# Patient Record
Sex: Male | Born: 1955 | Race: White | Hispanic: No | Marital: Married | Smoking: Never smoker
Health system: Southern US, Community
[De-identification: ages and names within clinical notes are randomized; demographics above are authoritative.]

## PROBLEM LIST (undated history)

## (undated) DIAGNOSIS — F329 Major depressive disorder, single episode, unspecified: Secondary | ICD-10-CM

## (undated) DIAGNOSIS — N400 Enlarged prostate without lower urinary tract symptoms: Secondary | ICD-10-CM

## (undated) DIAGNOSIS — T4145XA Adverse effect of unspecified anesthetic, initial encounter: Secondary | ICD-10-CM

## (undated) DIAGNOSIS — M654 Radial styloid tenosynovitis [de Quervain]: Secondary | ICD-10-CM

## (undated) DIAGNOSIS — T8859XA Other complications of anesthesia, initial encounter: Secondary | ICD-10-CM

## (undated) DIAGNOSIS — M1990 Unspecified osteoarthritis, unspecified site: Secondary | ICD-10-CM

## (undated) DIAGNOSIS — F32A Depression, unspecified: Secondary | ICD-10-CM

## (undated) HISTORY — PX: OTHER SURGICAL HISTORY: SHX169

## (undated) HISTORY — PX: NASAL SINUS SURGERY: SHX719

## (undated) HISTORY — PX: NECK SURGERY: SHX720

## (undated) HISTORY — PX: BACK SURGERY: SHX140

## (undated) HISTORY — PX: COLONOSCOPY W/ POLYPECTOMY: SHX1380

---

## 1988-05-26 HISTORY — PX: CERVICAL LAMINECTOMY: SHX94

## 2003-01-06 ENCOUNTER — Ambulatory Visit (HOSPITAL_COMMUNITY): Admission: RE | Admit: 2003-01-06 | Discharge: 2003-01-06 | Payer: Self-pay | Admitting: Family Medicine

## 2003-01-06 ENCOUNTER — Encounter: Payer: Self-pay | Admitting: Family Medicine

## 2003-11-20 ENCOUNTER — Emergency Department (HOSPITAL_COMMUNITY): Admission: EM | Admit: 2003-11-20 | Discharge: 2003-11-21 | Payer: Self-pay | Admitting: Emergency Medicine

## 2005-04-08 ENCOUNTER — Ambulatory Visit: Payer: Self-pay | Admitting: Gastroenterology

## 2005-09-02 ENCOUNTER — Ambulatory Visit (HOSPITAL_COMMUNITY): Admission: RE | Admit: 2005-09-02 | Discharge: 2005-09-02 | Payer: Self-pay | Admitting: Family Medicine

## 2005-11-06 ENCOUNTER — Ambulatory Visit (HOSPITAL_COMMUNITY): Admission: RE | Admit: 2005-11-06 | Discharge: 2005-11-06 | Payer: Self-pay | Admitting: Family Medicine

## 2007-12-22 ENCOUNTER — Ambulatory Visit: Payer: Self-pay | Admitting: Orthopedic Surgery

## 2007-12-22 DIAGNOSIS — M25519 Pain in unspecified shoulder: Secondary | ICD-10-CM

## 2007-12-22 DIAGNOSIS — M25819 Other specified joint disorders, unspecified shoulder: Secondary | ICD-10-CM | POA: Insufficient documentation

## 2007-12-22 DIAGNOSIS — M7580 Other shoulder lesions, unspecified shoulder: Secondary | ICD-10-CM

## 2007-12-22 DIAGNOSIS — M5030 Other cervical disc degeneration, unspecified cervical region: Secondary | ICD-10-CM | POA: Insufficient documentation

## 2008-01-11 ENCOUNTER — Telehealth: Payer: Self-pay | Admitting: Orthopedic Surgery

## 2008-02-28 ENCOUNTER — Ambulatory Visit (HOSPITAL_COMMUNITY): Admission: RE | Admit: 2008-02-28 | Discharge: 2008-02-28 | Payer: Self-pay | Admitting: Neurosurgery

## 2008-05-26 HISTORY — PX: CERVICAL FUSION: SHX112

## 2008-06-08 ENCOUNTER — Encounter: Admission: RE | Admit: 2008-06-08 | Discharge: 2008-06-08 | Payer: Self-pay | Admitting: Family Medicine

## 2008-06-22 ENCOUNTER — Encounter: Admission: RE | Admit: 2008-06-22 | Discharge: 2008-06-22 | Payer: Self-pay | Admitting: Family Medicine

## 2008-07-07 ENCOUNTER — Encounter: Admission: RE | Admit: 2008-07-07 | Discharge: 2008-07-07 | Payer: Self-pay | Admitting: Family Medicine

## 2009-01-08 ENCOUNTER — Ambulatory Visit (HOSPITAL_COMMUNITY): Admission: RE | Admit: 2009-01-08 | Discharge: 2009-01-09 | Payer: Self-pay | Admitting: Neurosurgery

## 2009-01-30 ENCOUNTER — Encounter: Admission: RE | Admit: 2009-01-30 | Discharge: 2009-01-30 | Payer: Self-pay | Admitting: Neurosurgery

## 2010-08-31 LAB — CBC
HCT: 42 % (ref 39.0–52.0)
MCV: 92.5 fL (ref 78.0–100.0)
RBC: 4.54 MIL/uL (ref 4.22–5.81)
WBC: 4.4 10*3/uL (ref 4.0–10.5)

## 2010-08-31 LAB — BASIC METABOLIC PANEL
Chloride: 105 mEq/L (ref 96–112)
GFR calc Af Amer: >60 mL/min (ref >60)
GFR calc non Af Amer: >60 mL/min (ref >60)
Potassium: 4.7 mEq/L (ref 3.5–5.1)
Sodium: 139 mEq/L (ref 135–145)

## 2010-10-08 NOTE — Op Note (Signed)
NAME:  Geoffrey Andrews, Geoffrey Andrews NO.:  [id number]   MEDICAL RECORD NO.:  [id number]          PATIENT TYPE:  OIB   LOCATION:  3523                         FACILITY:  [facility]   PHYSICIAN:  Reinaldo Meeker, M.D. DATE OF BIRTH:  [birth year]   DATE OF PROCEDURE:  01/08/2009  DATE OF DISCHARGE:                               OPERATIVE REPORT   PREOPERATIVE DIAGNOSIS:  Herniated disk, C5-C6 and C6-C7.   POSTOPERATIVE DIAGNOSIS:  Herniated disk, C5-C6 and C6-C7.   PROCEDURE:  C5-C6 and C6-C7 anterior cervical diskectomy with bone bank  fusion followed by Helix anterior cervical plating.   SURGEON:  Reinaldo Meeker, MD   ASSISTANT:  Donalee Citrin, MD   PROCEDURE IN DETAIL:  After placed in the supine position and pinned in  10 pounds halter traction, the patient's neck was prepped and draped in  the usual sterile fashion.  Localizing x-rays were taken prior to  incision to identify the appropriate level.  Transverse incision was  made at the right anterior neck, carried through the midline, and headed  towards the medial aspect of the sternocleidomastoid muscle.  The  platysma muscle was then incised transversely.  The natural fascial  plane between the strap muscles medially and sternocleidomastoid  laterally was identified and followed down to the anterior aspect of the  cervical spine.  Longus colli muscles were identified, split in the  midline, and stripped away bilaterally with Special educational needs teacher.  Self-retaining retractor was placed for exposure.  X-ray  showed approach to be at the appropriate level.  Using a #15 blade, the  end of disk at C5-C6 and C6-C7 was incised.  Using pituitary rongeurs  and curettes at both levels, approximately 90% of the disk material was  removed.  High-speed drill was used to widen the interspaces at both  levels.  At this time the microscope was draped, brought into the field  and used for the remainder of the case.  Starting at  C6-C7, residual  disk material down to the posterior longitudinal ligament was removed.  The ligament was incised in the transverse fashion and the cut edge  removed with a Kerrison punch.  Thorough decompression was carried out,  particularly towards the right side where the pathology was located at  this level.  Both C7 nerve roots were identified and followed to their  foramen more so on the right side than the left at this level.  At this  time, inspection was carried out in all directions for any evidence of  residual compression and none could be identified.  Attention was then  turned to C5-C6 and once again remainder of disk material was removed.  Once again, the posterior longitudinal ligament was incised and the cut  edges were removed with the Kerrison punch.  At this time, inspection of  the left C5-C6 foramen yielded large amounts of herniated disk material  and these were removed in a piecemeal fashion until the C6 nerve root  was well visualized and decompressed.  At this time, inspection was  carried out at  both levels for any evidence of residual compression and  none could be identified.  One 7 and one 8-mm bone bank plugs were  reconstituted.  After irrigating once more and confirming hemostasis,  the 7-mm plug was impacted to C5-C6 and the 8-mm plug impacted to C6-C7.  Fluoroscopy showed them to be in good position.  Appropriate length  anterior cervical plate was then chosen.  Under fluoroscopic guidance,  drill holes were placed followed by placement of 30-mm screws x6 until  the locking mechanism was secured bilaterally at L3 levels.  At this  time, large amounts of irrigation was  carried out.  Any bleeding was controlled with bipolar coagulation.  The  wound was then closed with interrupted Vicryl on the platysma muscle and  inverted 5-0 PDS subcuticular layer and Steri-Strips on the skin.  Sterile dressing and soft collar applied.  The patient was extubated and   taken to recovery room in stable condition.           ______________________________  Reinaldo Meeker, M.D.     ROK/MEDQ  D:  01/08/2009  T:  01/09/2009  Job:  [id number]

## 2012-10-25 ENCOUNTER — Ambulatory Visit (HOSPITAL_COMMUNITY)
Admission: RE | Admit: 2012-10-25 | Discharge: 2012-10-25 | Disposition: A | Discharge status: Home / Self Care | Payer: BC Managed Care – PPO | Source: Ambulatory Visit | Attending: Family Medicine | Admitting: Family Medicine

## 2012-10-25 ENCOUNTER — Other Ambulatory Visit (HOSPITAL_COMMUNITY): Payer: Self-pay | Admitting: Family Medicine

## 2012-10-25 DIAGNOSIS — T59891A Toxic effect of other specified gases, fumes and vapors, accidental (unintentional), initial encounter: Secondary | ICD-10-CM | POA: Insufficient documentation

## 2012-10-25 DIAGNOSIS — Z7722 Contact with and (suspected) exposure to environmental tobacco smoke (acute) (chronic): Secondary | ICD-10-CM

## 2012-10-25 DIAGNOSIS — T59811A Toxic effect of smoke, accidental (unintentional), initial encounter: Secondary | ICD-10-CM | POA: Insufficient documentation

## 2013-12-29 ENCOUNTER — Ambulatory Visit (INDEPENDENT_AMBULATORY_CARE_PROVIDER_SITE_OTHER): Payer: BC Managed Care – PPO | Admitting: Orthopedic Surgery

## 2013-12-29 ENCOUNTER — Ambulatory Visit (INDEPENDENT_AMBULATORY_CARE_PROVIDER_SITE_OTHER): Payer: BC Managed Care – PPO

## 2013-12-29 VITALS — BP 109/58 | Ht 74.0 in | Wt 244.0 lb

## 2013-12-29 DIAGNOSIS — M654 Radial styloid tenosynovitis [de Quervain]: Secondary | ICD-10-CM | POA: Insufficient documentation

## 2013-12-29 DIAGNOSIS — M79642 Pain in left hand: Secondary | ICD-10-CM

## 2013-12-29 DIAGNOSIS — M79609 Pain in unspecified limb: Secondary | ICD-10-CM

## 2013-12-29 HISTORY — DX: Radial styloid tenosynovitis (de quervain): M65.4

## 2013-12-29 NOTE — Progress Notes (Signed)
Subjective:     Patient ID: Geoffrey Andrews, male   DOB: 1955-09-02, 58 y.o.   MRN: [id number]  HPI  Chief complaint left wrist and thumb pain with bilateral shoulder pain for 3 months  The patient describes no injury to his thumb. He has some chronic shoulder pain depending on his activities. He complains of pain over the extensor compartment #1 left wrist and thumb with some burning and radiating up into the arm. His symptoms are worse with activity and unrelieved by Advil  Previous surgical fusion of the C-spine in 2011 he reports no medical history of chronic medications he has no allergies he has a negative family history except for history of cancer and arthritis his father died of prostate cancer mother died of colon cancer  Review of systems is negative except for his joint pain and his visual disturbance requiring glasses.   Review of Systems     Objective:   Physical Exam  Vital signs are stable as recorded  General appearance is normal, body habitus normal muscular  The patient is alert and oriented x 3  The patient's mood and affect are normal  Gait assessment: Normal  The cardiovascular exam reveals normal pulses and temperature without edema or  swelling.  The lymphatic system is negative for palpable lymph nodes  The sensory exam is normal.  There are no pathologic reflexes.  Balance is normal.  Shoulder strength is 5 out of 5 manual muscle testing Exam of the left wrist  Inspection tenderness no swelling over first extensor compartment Range of motion full range of motion with positive de Quervain's test Stability stability of the wrist confirmed Watson test Strength grade 5 motor strength  Skin normal, no rash, or laceration. Provocative tests de Quervain's positive  A/P X-rays normal     Assessment:     X-ray show no significant arthritic changes in the hand or wrist  Impression de Quervain's syndrome    Plan:     Inject left thumb for de  Quervain's syndrome  VERBAL CONSENT TO INJECT TIME OUT COMPLETED   The left wrist was cleaned with alcohol and sprayed with ethyl chloride   Then 1cc of depomedrol (40mg ) and 3 cc of 1% lidocaine was injected into the 1st ext compartment   No complications

## 2013-12-29 NOTE — Patient Instructions (Signed)
Ice for 20 minutes in the evenings

## 2014-07-03 ENCOUNTER — Other Ambulatory Visit (HOSPITAL_COMMUNITY): Payer: Self-pay | Admitting: Family Medicine

## 2014-07-03 ENCOUNTER — Ambulatory Visit (HOSPITAL_COMMUNITY)
Admission: RE | Admit: 2014-07-03 | Discharge: 2014-07-03 | Disposition: A | Discharge status: Home / Self Care | Payer: BLUE CROSS/BLUE SHIELD | Source: Ambulatory Visit | Attending: Family Medicine | Admitting: Family Medicine

## 2014-07-03 DIAGNOSIS — Z7722 Contact with and (suspected) exposure to environmental tobacco smoke (acute) (chronic): Secondary | ICD-10-CM

## 2014-09-06 ENCOUNTER — Other Ambulatory Visit (HOSPITAL_COMMUNITY): Payer: Self-pay | Admitting: Family Medicine

## 2014-09-06 ENCOUNTER — Ambulatory Visit (HOSPITAL_COMMUNITY)
Admission: RE | Admit: 2014-09-06 | Discharge: 2014-09-06 | Disposition: A | Discharge status: Home / Self Care | Payer: BLUE CROSS/BLUE SHIELD | Source: Ambulatory Visit | Attending: Family Medicine | Admitting: Family Medicine

## 2014-09-06 DIAGNOSIS — M5442 Lumbago with sciatica, left side: Secondary | ICD-10-CM

## 2014-09-12 ENCOUNTER — Other Ambulatory Visit (HOSPITAL_COMMUNITY): Payer: Self-pay | Admitting: Family Medicine

## 2014-09-12 DIAGNOSIS — M5441 Lumbago with sciatica, right side: Secondary | ICD-10-CM

## 2014-09-12 DIAGNOSIS — M5442 Lumbago with sciatica, left side: Principal | ICD-10-CM

## 2014-09-14 ENCOUNTER — Ambulatory Visit
Admission: RE | Admit: 2014-09-14 | Discharge: 2014-09-14 | Disposition: A | Discharge status: Home / Self Care | Payer: BLUE CROSS/BLUE SHIELD | Source: Ambulatory Visit | Attending: Family Medicine | Admitting: Family Medicine

## 2014-09-14 DIAGNOSIS — M5441 Lumbago with sciatica, right side: Secondary | ICD-10-CM

## 2014-09-14 DIAGNOSIS — M5442 Lumbago with sciatica, left side: Principal | ICD-10-CM

## 2014-09-15 ENCOUNTER — Other Ambulatory Visit (HOSPITAL_COMMUNITY): Payer: BLUE CROSS/BLUE SHIELD

## 2014-09-19 ENCOUNTER — Other Ambulatory Visit: Payer: Self-pay | Admitting: Family Medicine

## 2014-09-19 DIAGNOSIS — M5136 Other intervertebral disc degeneration, lumbar region: Secondary | ICD-10-CM

## 2014-09-21 ENCOUNTER — Ambulatory Visit
Admission: RE | Admit: 2014-09-21 | Discharge: 2014-09-21 | Disposition: A | Discharge status: Home / Self Care | Payer: BLUE CROSS/BLUE SHIELD | Source: Ambulatory Visit | Attending: Family Medicine | Admitting: Family Medicine

## 2014-09-21 DIAGNOSIS — M5136 Other intervertebral disc degeneration, lumbar region: Secondary | ICD-10-CM

## 2014-09-21 MED ORDER — METHYLPREDNISOLONE ACETATE 40 MG/ML INJ SUSP (RADIOLOG
120.0000 mg | Freq: Once | INTRAMUSCULAR | Status: AC
  Administered 2014-09-21: 120 mg via EPIDURAL

## 2014-09-21 MED ORDER — IOHEXOL 180 MG/ML  SOLN
1.0000 mL | Freq: Once | INTRAMUSCULAR | Status: AC | PRN
  Administered 2014-09-21: 1 mL via EPIDURAL

## 2014-09-21 NOTE — Discharge Instructions (Signed)

## 2014-10-09 ENCOUNTER — Other Ambulatory Visit: Payer: Self-pay | Admitting: Family Medicine

## 2014-10-09 DIAGNOSIS — M5136 Other intervertebral disc degeneration, lumbar region: Secondary | ICD-10-CM

## 2014-10-10 ENCOUNTER — Other Ambulatory Visit: Payer: BLUE CROSS/BLUE SHIELD

## 2014-10-13 ENCOUNTER — Ambulatory Visit
Admission: RE | Admit: 2014-10-13 | Discharge: 2014-10-13 | Disposition: A | Discharge status: Home / Self Care | Payer: BLUE CROSS/BLUE SHIELD | Source: Ambulatory Visit | Attending: Family Medicine | Admitting: Family Medicine

## 2014-10-13 DIAGNOSIS — M5136 Other intervertebral disc degeneration, lumbar region: Secondary | ICD-10-CM

## 2014-10-13 MED ORDER — IOHEXOL 180 MG/ML  SOLN
1.0000 mL | Freq: Once | INTRAMUSCULAR | Status: AC | PRN
  Administered 2014-10-13: 1 mL via EPIDURAL

## 2014-10-13 MED ORDER — METHYLPREDNISOLONE ACETATE 40 MG/ML INJ SUSP (RADIOLOG
120.0000 mg | Freq: Once | INTRAMUSCULAR | Status: AC
  Administered 2014-10-13: 120 mg via EPIDURAL

## 2015-02-21 ENCOUNTER — Other Ambulatory Visit (HOSPITAL_COMMUNITY): Payer: Self-pay | Admitting: Neurosurgery

## 2015-03-01 ENCOUNTER — Encounter (HOSPITAL_COMMUNITY): Payer: Self-pay

## 2015-03-01 ENCOUNTER — Encounter (HOSPITAL_COMMUNITY)
Admission: RE | Admit: 2015-03-01 | Discharge: 2015-03-01 | Disposition: A | Discharge status: Home / Self Care | Payer: BLUE CROSS/BLUE SHIELD | Source: Ambulatory Visit | Attending: Neurosurgery | Admitting: Neurosurgery

## 2015-03-01 DIAGNOSIS — M4806 Spinal stenosis, lumbar region: Secondary | ICD-10-CM | POA: Insufficient documentation

## 2015-03-01 DIAGNOSIS — Z01812 Encounter for preprocedural laboratory examination: Secondary | ICD-10-CM | POA: Diagnosis not present

## 2015-03-01 HISTORY — DX: Unspecified osteoarthritis, unspecified site: M19.90

## 2015-03-01 HISTORY — DX: Adverse effect of unspecified anesthetic, initial encounter: T41.45XA

## 2015-03-01 HISTORY — DX: Other complications of anesthesia, initial encounter: T88.59XA

## 2015-03-01 HISTORY — DX: Depression, unspecified: F32.A

## 2015-03-01 HISTORY — DX: Benign prostatic hyperplasia without lower urinary tract symptoms: N40.0

## 2015-03-01 HISTORY — DX: Major depressive disorder, single episode, unspecified: F32.9

## 2015-03-01 HISTORY — DX: Radial styloid tenosynovitis (de quervain): M65.4

## 2015-03-01 LAB — CBC
HCT: 42.3 % (ref 39.0–52.0)
Hemoglobin: 14.1 g/dL (ref 13.0–17.0)
MCH: 30 pg (ref 26.0–34.0)
MCHC: 33.3 g/dL (ref 30.0–36.0)
MCV: 90 fL (ref 78.0–100.0)
PLATELETS: 160 10*3/uL (ref 150–400)
RBC: 4.7 MIL/uL (ref 4.22–5.81)
RDW: 12.6 % (ref 11.5–15.5)
WBC: 6 10*3/uL (ref 4.0–10.5)

## 2015-03-01 LAB — BASIC METABOLIC PANEL
Anion gap: 10 (ref 5–15)
BUN: 14 mg/dL (ref 6–20)
CALCIUM: 9.4 mg/dL (ref 8.9–10.3)
CO2: 23 mmol/L (ref 22–32)
CREATININE: 0.9 mg/dL (ref 0.61–1.24)
Chloride: 103 mmol/L (ref 101–111)
GFR calc Af Amer: >60 mL/min (ref >60)
GLUCOSE: 117 mg/dL — AB (ref 65–99)
Potassium: 4 mmol/L (ref 3.5–5.1)
Sodium: 136 mmol/L (ref 135–145)

## 2015-03-01 LAB — SURGICAL PCR SCREEN
MRSA, PCR: NEGATIVE
STAPHYLOCOCCUS AUREUS: NEGATIVE

## 2015-03-01 NOTE — Progress Notes (Signed)
Mr Delduca denies shortness of breath or chest pain, has not seen a cardiologist.

## 2015-03-01 NOTE — Pre-Procedure Instructions (Signed)
    Geoffrey Andrews  03/01/2015    Your procedure is scheduled on Thursday, October 13.  Report to [facility] Admitting at 9:00 A.M.                 Your surgery is scheduled for 11:00A.M   Call this number if you have problems the morning of [phone]                 For any other questions, please call [phone], Monday - Friday 8 AM - 4 PM.   Remember:  Do not eat food or drink liquids after midnight Wednesdau, October 12  Take these medicines the morning of surgery with A SIP OF WATER : Dutasteride-Tamsulosin HCl (JALYN)   Do not wear jewelry, make-up or nail polish.   Do not wear lotions, powders, or perfumes.                Men may shave face and neck.   Do not bring valuables to the hospital.   [facility] is not responsible for any belongings or valuables.  Contacts, dentures or bridgework may not be worn into surgery.  Leave your suitcase in the car.  After surgery it may be brought to your room.  For patients admitted to the hospital, discharge time will be determined by your treatment team.  Patients discharged the day of surgery will not be allowed to drive home.   Name and phone number of your drive: -  Special instructions:  Review  [facility] - Preparing For Surgery.  Please read over the following fact sheets that you were given. Pain Booklet, Coughing and Deep Breathing and Surgical Site Infection Prevention

## 2015-03-07 MED ORDER — CEFAZOLIN SODIUM-DEXTROSE 2-3 GM-% IV SOLR
2.0000 g | INTRAVENOUS | Status: AC
  Administered 2015-03-08: 2 g via INTRAVENOUS
  Filled 2015-03-07: qty 50

## 2015-03-07 MED ORDER — DEXAMETHASONE SODIUM PHOSPHATE 10 MG/ML IJ SOLN
10.0000 mg | INTRAMUSCULAR | Status: AC
  Administered 2015-03-08: 10 mg via INTRAVENOUS
  Filled 2015-03-07: qty 1

## 2015-03-08 ENCOUNTER — Ambulatory Visit (HOSPITAL_COMMUNITY): Payer: BLUE CROSS/BLUE SHIELD

## 2015-03-08 ENCOUNTER — Encounter (HOSPITAL_COMMUNITY): Payer: Self-pay | Admitting: *Deleted

## 2015-03-08 ENCOUNTER — Ambulatory Visit (HOSPITAL_COMMUNITY): Payer: BLUE CROSS/BLUE SHIELD | Admitting: Anesthesiology

## 2015-03-08 ENCOUNTER — Encounter (HOSPITAL_COMMUNITY)
Admission: RE | Disposition: A | Discharge status: Home / Self Care | Payer: Self-pay | Source: Ambulatory Visit | Attending: Neurosurgery

## 2015-03-08 ENCOUNTER — Ambulatory Visit (HOSPITAL_COMMUNITY)
Admission: RE | Admit: 2015-03-08 | Discharge: 2015-03-09 | Disposition: A | Discharge status: Home / Self Care | Payer: BLUE CROSS/BLUE SHIELD | Source: Ambulatory Visit | Attending: Neurosurgery | Admitting: Neurosurgery

## 2015-03-08 DIAGNOSIS — M1990 Unspecified osteoarthritis, unspecified site: Secondary | ICD-10-CM | POA: Insufficient documentation

## 2015-03-08 DIAGNOSIS — M48062 Spinal stenosis, lumbar region with neurogenic claudication: Secondary | ICD-10-CM | POA: Diagnosis present

## 2015-03-08 DIAGNOSIS — N400 Enlarged prostate without lower urinary tract symptoms: Secondary | ICD-10-CM | POA: Insufficient documentation

## 2015-03-08 DIAGNOSIS — M4806 Spinal stenosis, lumbar region: Secondary | ICD-10-CM | POA: Insufficient documentation

## 2015-03-08 DIAGNOSIS — Z419 Encounter for procedure for purposes other than remedying health state, unspecified: Secondary | ICD-10-CM

## 2015-03-08 HISTORY — PX: LUMBAR LAMINECTOMY/DECOMPRESSION MICRODISCECTOMY: SHX5026

## 2015-03-08 SURGERY — LUMBAR LAMINECTOMY/DECOMPRESSION MICRODISCECTOMY 1 LEVEL
Anesthesia: General | Site: Back

## 2015-03-08 MED ORDER — PANTOPRAZOLE SODIUM 40 MG PO TBEC
40.0000 mg | DELAYED_RELEASE_TABLET | Freq: Every day | ORAL | Status: DC
  Administered 2015-03-08 – 2015-03-09 (×2): 40 mg via ORAL
  Filled 2015-03-08: qty 1

## 2015-03-08 MED ORDER — PROPOFOL 10 MG/ML IV BOLUS
INTRAVENOUS | Status: AC
  Filled 2015-03-08: qty 20

## 2015-03-08 MED ORDER — SODIUM CHLORIDE 0.9 % IJ SOLN: 3.0000 mL | INTRAMUSCULAR | Status: DC | PRN

## 2015-03-08 MED ORDER — DUTASTERIDE-TAMSULOSIN HCL 0.5-0.4 MG PO CAPS: 1.0000 | ORAL_CAPSULE | Freq: Every day | ORAL | Status: DC

## 2015-03-08 MED ORDER — BISACODYL 5 MG PO TBEC: 5.0000 mg | DELAYED_RELEASE_TABLET | Freq: Every day | ORAL | Status: DC | PRN

## 2015-03-08 MED ORDER — ACETAMINOPHEN 325 MG PO TABS: 650.0000 mg | ORAL_TABLET | ORAL | Status: DC | PRN

## 2015-03-08 MED ORDER — EPHEDRINE SULFATE 50 MG/ML IJ SOLN
INTRAMUSCULAR | Status: AC
  Filled 2015-03-08: qty 1

## 2015-03-08 MED ORDER — FENTANYL CITRATE (PF) 100 MCG/2ML IJ SOLN
INTRAMUSCULAR | Status: DC | PRN
  Administered 2015-03-08: 50 ug via INTRAVENOUS
  Administered 2015-03-08: 100 ug via INTRAVENOUS

## 2015-03-08 MED ORDER — TAMSULOSIN HCL 0.4 MG PO CAPS
0.4000 mg | ORAL_CAPSULE | Freq: Every day | ORAL | Status: DC
  Administered 2015-03-09: 0.4 mg via ORAL
  Filled 2015-03-08: qty 1

## 2015-03-08 MED ORDER — STERILE WATER FOR INJECTION IJ SOLN
INTRAMUSCULAR | Status: AC
  Filled 2015-03-08: qty 10

## 2015-03-08 MED ORDER — OXYCODONE HCL 5 MG/5ML PO SOLN
5.0000 mg | Freq: Once | ORAL | Status: DC | PRN
Start: 2015-03-08 — End: 2015-03-08

## 2015-03-08 MED ORDER — HYDROCODONE-ACETAMINOPHEN 5-325 MG PO TABS
1.0000 | ORAL_TABLET | ORAL | Status: DC | PRN
  Administered 2015-03-08 – 2015-03-09 (×3): 2 via ORAL
  Filled 2015-03-08 (×4): qty 2

## 2015-03-08 MED ORDER — LIDOCAINE HCL (CARDIAC) 20 MG/ML IV SOLN
INTRAVENOUS | Status: AC
  Filled 2015-03-08: qty 5

## 2015-03-08 MED ORDER — GLYCOPYRROLATE 0.2 MG/ML IJ SOLN
INTRAMUSCULAR | Status: AC
  Filled 2015-03-08: qty 3

## 2015-03-08 MED ORDER — PHENOL 1.4 % MT LIQD: 1.0000 | OROMUCOSAL | Status: DC | PRN

## 2015-03-08 MED ORDER — ONDANSETRON HCL 4 MG/2ML IJ SOLN: 4.0000 mg | INTRAMUSCULAR | Status: DC | PRN

## 2015-03-08 MED ORDER — DUTASTERIDE 0.5 MG PO CAPS
0.5000 mg | ORAL_CAPSULE | Freq: Every day | ORAL | Status: DC
  Administered 2015-03-09: 0.5 mg via ORAL
  Filled 2015-03-08: qty 1

## 2015-03-08 MED ORDER — PANTOPRAZOLE SODIUM 40 MG IV SOLR: 40.0000 mg | Freq: Every day | INTRAVENOUS | Status: DC

## 2015-03-08 MED ORDER — KETOROLAC TROMETHAMINE 30 MG/ML IJ SOLN
30.0000 mg | Freq: Four times a day (QID) | INTRAMUSCULAR | Status: DC
  Administered 2015-03-08 – 2015-03-09 (×3): 30 mg via INTRAVENOUS
  Filled 2015-03-08 (×3): qty 1

## 2015-03-08 MED ORDER — HEMOSTATIC AGENTS (NO CHARGE) OPTIME
TOPICAL | Status: DC | PRN
  Administered 2015-03-08 (×2): 1 via TOPICAL

## 2015-03-08 MED ORDER — MIDAZOLAM HCL 5 MG/5ML IJ SOLN
INTRAMUSCULAR | Status: DC | PRN
  Administered 2015-03-08 (×2): 1 mg via INTRAVENOUS

## 2015-03-08 MED ORDER — THROMBIN 5000 UNITS EX SOLR
CUTANEOUS | Status: DC | PRN
  Administered 2015-03-08 (×4): 5000 [IU] via TOPICAL

## 2015-03-08 MED ORDER — DOCUSATE SODIUM 100 MG PO CAPS
100.0000 mg | ORAL_CAPSULE | Freq: Two times a day (BID) | ORAL | Status: DC
  Administered 2015-03-08 – 2015-03-09 (×2): 100 mg via ORAL
  Filled 2015-03-08 (×2): qty 1

## 2015-03-08 MED ORDER — CEFAZOLIN SODIUM-DEXTROSE 2-3 GM-% IV SOLR
2.0000 g | Freq: Three times a day (TID) | INTRAVENOUS | Status: AC
  Administered 2015-03-08 – 2015-03-09 (×2): 2 g via INTRAVENOUS
  Filled 2015-03-08 (×2): qty 50

## 2015-03-08 MED ORDER — SODIUM CHLORIDE 0.9 % IV SOLN: 250.0000 mL | INTRAVENOUS | Status: DC

## 2015-03-08 MED ORDER — NEOSTIGMINE METHYLSULFATE 10 MG/10ML IV SOLN
INTRAVENOUS | Status: AC
  Filled 2015-03-08: qty 1

## 2015-03-08 MED ORDER — ROCURONIUM BROMIDE 100 MG/10ML IV SOLN
INTRAVENOUS | Status: DC | PRN
  Administered 2015-03-08: 50 mg via INTRAVENOUS

## 2015-03-08 MED ORDER — CYCLOBENZAPRINE HCL 10 MG PO TABS
10.0000 mg | ORAL_TABLET | Freq: Three times a day (TID) | ORAL | Status: DC | PRN
  Administered 2015-03-08: 10 mg via ORAL
  Filled 2015-03-08: qty 1

## 2015-03-08 MED ORDER — SODIUM CHLORIDE 0.9 % IR SOLN
Status: DC | PRN
  Administered 2015-03-08: 13:00:00

## 2015-03-08 MED ORDER — HYDROMORPHONE HCL 1 MG/ML IJ SOLN
INTRAMUSCULAR | Status: AC
  Administered 2015-03-08: 0.5 mg via INTRAVENOUS
  Filled 2015-03-08: qty 1

## 2015-03-08 MED ORDER — DEXAMETHASONE SODIUM PHOSPHATE 4 MG/ML IJ SOLN: 4.0000 mg | Freq: Four times a day (QID) | INTRAMUSCULAR | Status: AC

## 2015-03-08 MED ORDER — SODIUM CHLORIDE 0.9 % IJ SOLN: 3.0000 mL | Freq: Two times a day (BID) | INTRAMUSCULAR | Status: DC

## 2015-03-08 MED ORDER — VECURONIUM BROMIDE 10 MG IV SOLR
INTRAVENOUS | Status: AC
  Filled 2015-03-08: qty 10

## 2015-03-08 MED ORDER — ZOLPIDEM TARTRATE 5 MG PO TABS: 5.0000 mg | ORAL_TABLET | Freq: Every evening | ORAL | Status: DC | PRN

## 2015-03-08 MED ORDER — LIDOCAINE HCL (CARDIAC) 20 MG/ML IV SOLN
INTRAVENOUS | Status: DC | PRN
  Administered 2015-03-08: 60 mg via INTRAVENOUS

## 2015-03-08 MED ORDER — PROPOFOL 10 MG/ML IV BOLUS
INTRAVENOUS | Status: DC | PRN
  Administered 2015-03-08: 200 mg via INTRAVENOUS

## 2015-03-08 MED ORDER — PROMETHAZINE HCL 25 MG/ML IJ SOLN: 6.2500 mg | INTRAMUSCULAR | Status: DC | PRN

## 2015-03-08 MED ORDER — HYDROMORPHONE HCL 1 MG/ML IJ SOLN: 1.0000 mg | INTRAMUSCULAR | Status: DC | PRN

## 2015-03-08 MED ORDER — EPHEDRINE SULFATE 50 MG/ML IJ SOLN
INTRAMUSCULAR | Status: DC | PRN
  Administered 2015-03-08: 5 mg via INTRAVENOUS

## 2015-03-08 MED ORDER — DEXAMETHASONE 4 MG PO TABS
4.0000 mg | ORAL_TABLET | Freq: Four times a day (QID) | ORAL | Status: AC
  Administered 2015-03-08 (×2): 4 mg via ORAL
  Filled 2015-03-08 (×2): qty 1

## 2015-03-08 MED ORDER — OXYCODONE HCL 5 MG PO TABS: 5.0000 mg | ORAL_TABLET | Freq: Once | ORAL | Status: DC | PRN

## 2015-03-08 MED ORDER — LACTATED RINGERS IV SOLN
INTRAVENOUS | Status: DC
  Administered 2015-03-08 (×2): via INTRAVENOUS
  Administered 2015-03-08: 50 mL/h via INTRAVENOUS

## 2015-03-08 MED ORDER — KETOROLAC TROMETHAMINE 30 MG/ML IJ SOLN
INTRAMUSCULAR | Status: DC | PRN
  Administered 2015-03-08: 30 mg via INTRAVENOUS

## 2015-03-08 MED ORDER — KCL IN DEXTROSE-NACL 20-5-0.45 MEQ/L-%-% IV SOLN
80.0000 mL/h | INTRAVENOUS | Status: DC
  Filled 2015-03-08 (×4): qty 1000

## 2015-03-08 MED ORDER — ROCURONIUM BROMIDE 50 MG/5ML IV SOLN
INTRAVENOUS | Status: AC
  Filled 2015-03-08: qty 1

## 2015-03-08 MED ORDER — ALUM & MAG HYDROXIDE-SIMETH 200-200-20 MG/5ML PO SUSP: 30.0000 mL | Freq: Four times a day (QID) | ORAL | Status: DC | PRN

## 2015-03-08 MED ORDER — ONDANSETRON HCL 4 MG/2ML IJ SOLN
INTRAMUSCULAR | Status: AC
  Filled 2015-03-08: qty 2

## 2015-03-08 MED ORDER — MENTHOL 3 MG MT LOZG: 1.0000 | LOZENGE | OROMUCOSAL | Status: DC | PRN

## 2015-03-08 MED ORDER — GLYCOPYRROLATE 0.2 MG/ML IJ SOLN
INTRAMUSCULAR | Status: DC | PRN
  Administered 2015-03-08: 0.6 mg via INTRAVENOUS
  Administered 2015-03-08 (×2): 0.1 mg via INTRAVENOUS

## 2015-03-08 MED ORDER — KETOROLAC TROMETHAMINE 30 MG/ML IJ SOLN
INTRAMUSCULAR | Status: AC
  Filled 2015-03-08: qty 1

## 2015-03-08 MED ORDER — HYDROMORPHONE HCL 1 MG/ML IJ SOLN
INTRAMUSCULAR | Status: AC
  Filled 2015-03-08: qty 1

## 2015-03-08 MED ORDER — 0.9 % SODIUM CHLORIDE (POUR BTL) OPTIME
TOPICAL | Status: DC | PRN
  Administered 2015-03-08: 1000 mL

## 2015-03-08 MED ORDER — NEOSTIGMINE METHYLSULFATE 10 MG/10ML IV SOLN
INTRAVENOUS | Status: DC | PRN
  Administered 2015-03-08: 4 mg via INTRAVENOUS

## 2015-03-08 MED ORDER — ARTIFICIAL TEARS OP OINT
TOPICAL_OINTMENT | OPHTHALMIC | Status: DC | PRN
  Administered 2015-03-08: 1 via OPHTHALMIC

## 2015-03-08 MED ORDER — ONDANSETRON HCL 4 MG/2ML IJ SOLN
INTRAMUSCULAR | Status: DC | PRN
  Administered 2015-03-08: 4 mg via INTRAVENOUS

## 2015-03-08 MED ORDER — ACETAMINOPHEN 650 MG RE SUPP: 650.0000 mg | RECTAL | Status: DC | PRN

## 2015-03-08 MED ORDER — BUPIVACAINE HCL (PF) 0.5 % IJ SOLN
INTRAMUSCULAR | Status: DC | PRN
  Administered 2015-03-08: 20 mL

## 2015-03-08 MED ORDER — MIDAZOLAM HCL 2 MG/2ML IJ SOLN
INTRAMUSCULAR | Status: AC
  Filled 2015-03-08: qty 4

## 2015-03-08 MED ORDER — VECURONIUM BROMIDE 10 MG IV SOLR
INTRAVENOUS | Status: DC | PRN
  Administered 2015-03-08: 3 mg via INTRAVENOUS

## 2015-03-08 MED ORDER — HYDROMORPHONE HCL 1 MG/ML IJ SOLN
0.2500 mg | INTRAMUSCULAR | Status: DC | PRN
  Administered 2015-03-08 (×3): 0.5 mg via INTRAVENOUS

## 2015-03-08 MED ORDER — ARTIFICIAL TEARS OP OINT
TOPICAL_OINTMENT | OPHTHALMIC | Status: AC
  Filled 2015-03-08: qty 3.5

## 2015-03-08 MED ORDER — FENTANYL CITRATE (PF) 250 MCG/5ML IJ SOLN
INTRAMUSCULAR | Status: AC
  Filled 2015-03-08: qty 5

## 2015-03-08 SURGICAL SUPPLY — 45 items
BAG DECANTER FOR FLEXI CONT (MISCELLANEOUS) ×3 IMPLANT
BENZOIN TINCTURE PRP APPL 2/3 (GAUZE/BANDAGES/DRESSINGS) ×3 IMPLANT
BLADE CLIPPER SURG (BLADE) ×3 IMPLANT
BRUSH SCRUB EZ PLAIN DRY (MISCELLANEOUS) ×3 IMPLANT
BUR CUTTER 7.0 ROUND (BURR) ×6 IMPLANT
CANISTER SUCT 3000ML PPV (MISCELLANEOUS) ×3 IMPLANT
CLOSURE WOUND 1/2 X4 (GAUZE/BANDAGES/DRESSINGS) ×1
DERMABOND ADVANCED (GAUZE/BANDAGES/DRESSINGS) ×2
DERMABOND ADVANCED .7 DNX12 (GAUZE/BANDAGES/DRESSINGS) ×1 IMPLANT
DRAPE LAPAROTOMY 100X72X124 (DRAPES) ×3 IMPLANT
DRAPE MICROSCOPE LEICA (MISCELLANEOUS) ×3 IMPLANT
DRAPE POUCH INSTRU U-SHP 10X18 (DRAPES) ×3 IMPLANT
DRAPE SURG 17X23 STRL (DRAPES) ×6 IMPLANT
DRSG OPSITE 4X5.5 SM (GAUZE/BANDAGES/DRESSINGS) ×3 IMPLANT
DRSG OPSITE POSTOP 4X6 (GAUZE/BANDAGES/DRESSINGS) ×3 IMPLANT
ELECT BLADE 4.0 EZ CLEAN MEGAD (MISCELLANEOUS) ×3
ELECT REM PT RETURN 9FT ADLT (ELECTROSURGICAL) ×3
ELECTRODE BLDE 4.0 EZ CLN MEGD (MISCELLANEOUS) ×1 IMPLANT
ELECTRODE REM PT RTRN 9FT ADLT (ELECTROSURGICAL) ×1 IMPLANT
EVACUATOR 1/8 PVC DRAIN (DRAIN) ×3 IMPLANT
GAUZE SPONGE 4X4 12PLY STRL (GAUZE/BANDAGES/DRESSINGS) ×3 IMPLANT
GAUZE SPONGE 4X4 16PLY XRAY LF (GAUZE/BANDAGES/DRESSINGS) ×6 IMPLANT
GLOVE ECLIPSE 8.0 STRL XLNG CF (GLOVE) ×3 IMPLANT
GOWN STRL REUS W/ TWL LRG LVL3 (GOWN DISPOSABLE) IMPLANT
GOWN STRL REUS W/ TWL XL LVL3 (GOWN DISPOSABLE) ×1 IMPLANT
GOWN STRL REUS W/TWL 2XL LVL3 (GOWN DISPOSABLE) IMPLANT
GOWN STRL REUS W/TWL LRG LVL3 (GOWN DISPOSABLE)
GOWN STRL REUS W/TWL XL LVL3 (GOWN DISPOSABLE) ×2
KIT BASIN OR (CUSTOM PROCEDURE TRAY) ×3 IMPLANT
KIT ROOM TURNOVER OR (KITS) ×3 IMPLANT
NEEDLE HYPO 22GX1.5 SAFETY (NEEDLE) ×3 IMPLANT
NEEDLE SPNL 22GX3.5 QUINCKE BK (NEEDLE) ×3 IMPLANT
NS IRRIG 1000ML POUR BTL (IV SOLUTION) ×3 IMPLANT
PACK LAMINECTOMY NEURO (CUSTOM PROCEDURE TRAY) ×3 IMPLANT
PAD ARMBOARD 7.5X6 YLW CONV (MISCELLANEOUS) ×9 IMPLANT
PATTIES SURGICAL .75X.75 (GAUZE/BANDAGES/DRESSINGS) IMPLANT
RUBBERBAND STERILE (MISCELLANEOUS) ×6 IMPLANT
SPONGE SURGIFOAM ABS GEL SZ50 (HEMOSTASIS) ×3 IMPLANT
STAPLER SKIN PROX WIDE 3.9 (STAPLE) ×3 IMPLANT
STRIP CLOSURE SKIN 1/2X4 (GAUZE/BANDAGES/DRESSINGS) ×2 IMPLANT
SUT VIC AB 2-0 OS6 18 (SUTURE) ×12 IMPLANT
SUT VIC AB 3-0 CP2 18 (SUTURE) ×6 IMPLANT
TOWEL OR 17X24 6PK STRL BLUE (TOWEL DISPOSABLE) ×3 IMPLANT
TOWEL OR 17X26 10 PK STRL BLUE (TOWEL DISPOSABLE) ×3 IMPLANT
WATER STERILE IRR 1000ML POUR (IV SOLUTION) ×3 IMPLANT

## 2015-03-08 NOTE — Op Note (Signed)
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 Localizing x-rays taken prior to incision to identify the appropriate level. Midline incision was made above the spinous processes of L3-L4 and L5. Using Bovie cutting current the incision was carried on the spinous processes. Subperiosteal dissection was then carried out bilaterally on the spinous processes lamina facet joints of 2 tract was placed for exposure. X-ray was taken which showed approach the appropriate level. Spinous processes of L3-L4 and L5 were removed. Starting at L3-4 on the left side generous laminotomy was performed by removing the inferior one half of the L3 lamina the medial third of the facet joint the superior one third of the L4 lamina. Residual bone and markedly hypertrophic ligamentum flavum removed. Similar decompression was then carried out on the opposite side and then residual midline structures were removed to complete the decompression and relieve the central lateral recess stenosis. L4 nerve root was identified and tracked out its foramen. We then did a similar decompression at L4-5. Once again removed the inferior one half of the L4 lamina the medial one third of facet joint the superior one third of the L5 lamina. Once again residual bone and ligamentum flavum removed bilaterally out. Once again, generous decompression of the thecal sac was carried out and the nerve roots were tracked out their foramen. This time inspection was carried out at both levels for any evidence of residual compression and none could be identified. Irrigation was carried out and any bleeding control proper coagulation  Gelfoam. An epidural drain was left in the epidural space and brought out through separate stab wound incision. The wound was then closed in multiple layers of Vicryl on the muscle fascia subcutaneous and subcuticular tissues. Dermabond and Steri-Strips were placed on the skin. A sterile dressing was then applied and the patient was extubated and taken to recovery room in stable condition.

## 2015-03-08 NOTE — Anesthesia Procedure Notes (Signed)
Procedure Name: Intubation Performed by: Everlene BallsHAYES, Faythe Heitzenrater T Pre-anesthesia Checklist: Patient identified, Emergency Drugs available, Suction available, Patient being monitored and Timeout performed Patient Re-evaluated:Patient Re-evaluated prior to inductionOxygen Delivery Method: Circle system utilized Preoxygenation: Pre-oxygenation with 100% oxygen Intubation Type: IV induction Ventilation: Mask ventilation without difficulty and Oral airway inserted - appropriate to patient size Laryngoscope Size: Mac and 4 Grade View: Grade III Tube type: Oral Tube size: 7.5 mm Number of attempts: 1 Airway Equipment and Method: Stylet Placement Confirmation: ETT inserted through vocal cords under direct vision,  breath sounds checked- equal and bilateral and positive ETCO2 Secured at: 22 cm Tube secured with: Tape Dental Injury: Teeth and Oropharynx as per pre-operative assessment

## 2015-03-08 NOTE — Transfer of Care (Signed)
Immediate Anesthesia Transfer of Care Note  Patient: Geoffrey FusiJohn L Andrews  Procedure(s) Performed: Procedure(s): Lumbar vertebral three to four, lumbar vertebral four to five lumbar decompression (N/A)  Patient Location: PACU  Anesthesia Type:General  Level of Consciousness: patient cooperative and responds to stimulation  Airway & Oxygen Therapy: Patient Spontanous Breathing and Patient connected to nasal cannula oxygen  Post-op Assessment: Report given to RN and Post -op Vital signs reviewed and stable  Post vital signs: Reviewed and stable  Last Vitals:  Filed Vitals:   03/08/15 0912  BP: 117/70  Pulse: 65  Temp: 37.1 C  Resp: 20    Complications: No apparent anesthesia complications

## 2015-03-08 NOTE — Progress Notes (Signed)
Receiving report from Sheilah MinsJamie Hart RN at this time

## 2015-03-08 NOTE — Progress Notes (Signed)
In line to go to 3c. Was told 2 patients ahead of me.

## 2015-03-08 NOTE — Anesthesia Preprocedure Evaluation (Addendum)
Anesthesia Evaluation  Patient identified by MRN, date of birth, ID band Patient awake    Reviewed: Allergy & Precautions, NPO status , Patient's Chart, lab work & pertinent test results  Airway Mallampati: II  TM Distance: >3 FB Neck ROM: Full    Dental  (+) Dental Advisory Given, Teeth Intact   Pulmonary neg pulmonary ROS,    breath sounds clear to auscultation       Cardiovascular negative cardio ROS   Rhythm:Regular Rate:Normal     Neuro/Psych negative neurological ROS     GI/Hepatic negative GI ROS, Neg liver ROS,   Endo/Other  negative endocrine ROS  Renal/GU negative Renal ROS     Musculoskeletal  (+) Arthritis ,   Abdominal   Peds  Hematology negative hematology ROS (+)   Anesthesia Other Findings   Reproductive/Obstetrics                            Lab Results  Component Value Date   WBC 6.0 03/01/2015   HGB 14.1 03/01/2015   HCT 42.3 03/01/2015   MCV 90.0 03/01/2015   PLT 160 03/01/2015   Lab Results  Component Value Date   CREATININE 0.90 03/01/2015   BUN 14 03/01/2015   NA 136 03/01/2015   K 4.0 03/01/2015   CL 103 03/01/2015   CO2 23 03/01/2015    Anesthesia Physical Anesthesia Plan  ASA: II  Anesthesia Plan: General   Post-op Pain Management:    Induction: Intravenous  Airway Management Planned: Oral ETT  Additional Equipment:   Intra-op Plan:   Post-operative Plan: Extubation in OR  Informed Consent: I have reviewed the patients History and Physical, chart, labs and discussed the procedure including the risks, benefits and alternatives for the proposed anesthesia with the patient or authorized representative who has indicated his/her understanding and acceptance.   Dental advisory given  Plan Discussed with: CRNA  Anesthesia Plan Comments:         Anesthesia Quick Evaluation

## 2015-03-08 NOTE — Anesthesia Postprocedure Evaluation (Signed)
  Anesthesia Post-op Note  Patient: Geoffrey Andrews  Procedure(s) Performed: Procedure(s): Lumbar vertebral three to four, lumbar vertebral four to five lumbar decompression (N/A)  Patient Location: PACU  Anesthesia Type:General  Level of Consciousness: awake, alert  and oriented  Airway and Oxygen Therapy: Patient Spontanous Breathing  Post-op Pain: mild  Post-op Assessment: Post-op Vital signs reviewed LLE Motor Response: Purposeful movement, Responds to commands LLE Sensation: No numbness RLE Motor Response: Purposeful movement, Responds to commands RLE Sensation: No numbness      Post-op Vital Signs: Reviewed  Last Vitals:  Filed Vitals:   03/08/15 1538  BP:   Pulse:   Temp: 36.2 C  Resp:     Complications: No apparent anesthesia complications

## 2015-03-08 NOTE — H&P (Signed)
Geoffrey Andrews is an 59 y.o. male.   Chief Complaint: Back pain into the legs HPI: The patient is a 59 year old gentleman who is evaluated in the office for back pain with radiation into the legs morselized the left than the right. Satisfied for number of months with no inciting event. It is worse with walking and better with rest. We therefore will also help the situation. He's tried chiropractic treatments and epidural shots which gave him no relief. An MRI scan had been done which showed significant stenosis at L3-4 and L4-5. The patient was tried an additional conservative therapy without improvement large proceed with surgery. We initially planned to do a two-level bilateral decompression with intralaminar Coflex but his insurance would not approve this and spite of evidence showing that it would decrease his need for further surgery over a 5 year period. We therefore discussed the options because of the severe pain we decided to go ahead and proceed with a bilateral decompression at L3-4 and L4-5. I've had a long discussion with him regarding the risks and benefits of surgical intervention. The risks discussed include but are not limited to bleeding infection weakness numbness paralysis spinal fluid leak coma and death. We have discussed alternative methods of therapy along with risks and benefits of nonintervention. He's had the opportunity to ask numerous questions and appears to understand. With this information in hand he has requested we proceed with surgery.  Past Medical History  Diagnosis Date  . De Quervain's syndrome (tenosynovitis) 12/29/13    Left  . BPH (benign prostatic hyperplasia)   . Complication of anesthesia     Slow to awaken.  [name] very few mediations )  . Depression     situational  . Arthritis     Past Surgical History  Procedure Laterality Date  . Colonoscopy w/ polypectomy    . Cervical fusion  2010  . Cervical laminectomy  1990  . Nasal sinus surgery    . Male  fertility      History reviewed. No pertinent family history. Social History:  reports that he has never smoked. He has never used smokeless tobacco. He reports that he does not drink alcohol or use illicit drugs.  Allergies: No Known Allergies  Medications Prior to Admission  Medication Sig Dispense Refill  . Dutasteride-Tamsulosin HCl (JALYN) 0.5-0.4 MG CAPS Take 1 capsule by mouth daily.    Marland Kitchen HYDROcodone-acetaminophen (NORCO/VICODIN) 5-325 MG tablet Take 1 tablet by mouth every 6 (six) hours as needed for moderate pain.    . Multiple Vitamin (MULTIVITAMIN WITH MINERALS) TABS tablet Take 1 tablet by mouth daily.    Marland Kitchen oxymetazoline (AFRIN) 0.05 % nasal spray Place 1 spray into both nostrils at bedtime.    Marland Kitchen ibuprofen (ADVIL,MOTRIN) 600 MG tablet Take 600 mg by mouth every 8 (eight) hours as needed for moderate pain (600- 800).      No results found for this or any previous visit (from the past 48 hour(s)). No results found.  Pertinent items are noted in HPI.  Blood pressure 117/70, pulse 65, temperature 98.7 F (37.1 C), temperature source Oral, resp. rate 20, height  (1.88 m), weight 114.76 kg (253 lb), SpO2 100 %.  The patient is awake alert and oriented. His no facial asymmetry. He has decreased dorsiflexion and extensor hallucis longus on the left. Deep tendon reflexes are normal as is his sensation Assessment/Plan Impression is that of spinal stenosis at L3-4 L4-5. The plan is for a two-level decompression.  Geoffrey Andrews,Geoffrey Andrews O, MD 03/08/2015, 11:39 AM

## 2015-03-09 ENCOUNTER — Encounter (HOSPITAL_COMMUNITY): Payer: Self-pay | Admitting: Neurosurgery

## 2015-03-09 DIAGNOSIS — M4806 Spinal stenosis, lumbar region: Secondary | ICD-10-CM | POA: Diagnosis not present

## 2015-03-09 NOTE — Discharge Summary (Signed)
  Physician Discharge Summary  Patient ID: Gunnar FusiJohn L Wilz MRN: [id number] DOB/AGE: 59-Jun-1957 59 y.o.  Admit date: 03/08/2015 Discharge date: 03/09/2015  Admission Diagnoses:  Discharge Diagnoses:  Active Problems:   Lumbar stenosis with neurogenic claudication   Discharged Condition: good  Hospital Course: Surgery yesterday with 2 level lumbar decompression. Did well with marked improvement in her pain. Wound healing well. Ambulated well. Home with specific instructions given.  Consults: None  Significant Diagnostic Studies: none  Treatments: surgery: L 34 L 45 decompressive lam  Discharge Exam: Blood pressure 125/62, pulse 65, temperature 98.3 F (36.8 C), temperature source Oral, resp. rate 18, height 6\' 2"  (1.88 m), weight 114.76 kg (253 lb), SpO2 100 %. Incision/Wound:clean and dry; no new neuro issues noted  Disposition:      Medication List    ASK your doctor about these medications        HYDROcodone-acetaminophen 5-325 MG tablet  Commonly known as:  NORCO/VICODIN  Take 1 tablet by mouth every 6 (six) hours as needed for moderate pain.     ibuprofen 600 MG tablet  Commonly known as:  ADVIL,MOTRIN  Take 600 mg by mouth every 8 (eight) hours as needed for moderate pain (600- 800).     JALYN 0.5-0.4 MG Caps  Generic drug:  Dutasteride-Tamsulosin HCl  Take 1 capsule by mouth daily.     multivitamin with minerals Tabs tablet  Take 1 tablet by mouth daily.     oxymetazoline 0.05 % nasal spray  Commonly known as:  AFRIN  Place 1 spray into both nostrils at bedtime.         At home rest most of the time. Get up 9 or 10 times each day and take a 15 or 20 minute walk. No riding in the car and to your first postoperative appointment. If you have neck surgery you may shower from the chest down starting on the third postoperative day. If you had back surgery he may start showering on the third postoperative day with saran wrap wrapped around your incisional area 3  times. After the shower remove the saran wrap. Take pain medicine as needed and other medications as instructed. Call my office for an appointment.  SignedReinaldo Meeker: Rondle Lohse O, MD 03/09/2015, 2:05 PM

## 2015-03-09 NOTE — Progress Notes (Signed)
Patient alert and oriented, mae's well, voiding adequate amount of urine, swallowing without difficulty, c/o pain and medication given prior to discharged. Patient discharged home with family. Script and discharged instructions given to patient. Patient and family stated understanding of d/c instructions given and has an appointment with MD.  

## 2015-08-14 ENCOUNTER — Ambulatory Visit (INDEPENDENT_AMBULATORY_CARE_PROVIDER_SITE_OTHER): Payer: BLUE CROSS/BLUE SHIELD | Admitting: Urology

## 2015-08-14 DIAGNOSIS — Z125 Encounter for screening for malignant neoplasm of prostate: Secondary | ICD-10-CM

## 2015-08-14 DIAGNOSIS — N5201 Erectile dysfunction due to arterial insufficiency: Secondary | ICD-10-CM | POA: Diagnosis not present

## 2015-08-14 DIAGNOSIS — N401 Enlarged prostate with lower urinary tract symptoms: Secondary | ICD-10-CM | POA: Diagnosis not present

## 2016-05-06 ENCOUNTER — Ambulatory Visit (INDEPENDENT_AMBULATORY_CARE_PROVIDER_SITE_OTHER): Payer: BLUE CROSS/BLUE SHIELD | Admitting: Urology

## 2016-05-06 DIAGNOSIS — N5201 Erectile dysfunction due to arterial insufficiency: Secondary | ICD-10-CM | POA: Diagnosis not present

## 2016-05-06 DIAGNOSIS — N401 Enlarged prostate with lower urinary tract symptoms: Secondary | ICD-10-CM | POA: Diagnosis not present

## 2017-05-12 ENCOUNTER — Ambulatory Visit (INDEPENDENT_AMBULATORY_CARE_PROVIDER_SITE_OTHER): Payer: Self-pay | Admitting: Urology

## 2017-05-12 DIAGNOSIS — N5201 Erectile dysfunction due to arterial insufficiency: Secondary | ICD-10-CM

## 2017-05-12 DIAGNOSIS — N401 Enlarged prostate with lower urinary tract symptoms: Secondary | ICD-10-CM

## 2018-05-11 ENCOUNTER — Ambulatory Visit (INDEPENDENT_AMBULATORY_CARE_PROVIDER_SITE_OTHER): Payer: Self-pay | Admitting: Urology

## 2018-05-11 DIAGNOSIS — N5201 Erectile dysfunction due to arterial insufficiency: Secondary | ICD-10-CM

## 2018-05-11 DIAGNOSIS — R3912 Poor urinary stream: Secondary | ICD-10-CM

## 2018-05-11 DIAGNOSIS — N401 Enlarged prostate with lower urinary tract symptoms: Secondary | ICD-10-CM

## 2019-03-30 ENCOUNTER — Other Ambulatory Visit: Payer: Self-pay

## 2019-03-30 DIAGNOSIS — Z20822 Contact with and (suspected) exposure to covid-19: Secondary | ICD-10-CM

## 2019-03-31 LAB — NOVEL CORONAVIRUS, NAA: SARS-CoV-2, NAA: DETECTED — AB

## 2019-04-01 ENCOUNTER — Ambulatory Visit: Payer: Self-pay | Admitting: *Deleted

## 2019-04-01 NOTE — Telephone Encounter (Signed)
Message from Lennox Solders sent at 04/01/2019 9:07 AM EST  Summary: covid 19 positve   Pt received a call from a triage nurse with positive covid 19 results. The called was d/c and pt is calling back needing further instructions          Returned call to patient. Pt has already received positive COVID-19 results and instructions. See result note.  No further questions or concerns voiced at this time.

## 2019-10-20 ENCOUNTER — Other Ambulatory Visit: Payer: Self-pay | Admitting: Adult Health

## 2019-10-20 DIAGNOSIS — M5441 Lumbago with sciatica, right side: Secondary | ICD-10-CM

## 2019-10-20 DIAGNOSIS — M5442 Lumbago with sciatica, left side: Secondary | ICD-10-CM

## 2019-11-11 ENCOUNTER — Other Ambulatory Visit: Payer: Self-pay

## 2019-11-11 DIAGNOSIS — N400 Enlarged prostate without lower urinary tract symptoms: Secondary | ICD-10-CM

## 2019-11-11 MED ORDER — FINASTERIDE 5 MG PO TABS: 5.0000 mg | ORAL_TABLET | Freq: Every day | ORAL | 0 refills | Status: DC

## 2019-11-21 ENCOUNTER — Ambulatory Visit
Admission: RE | Admit: 2019-11-21 | Discharge: 2019-11-21 | Disposition: A | Discharge status: Home / Self Care | Payer: No Typology Code available for payment source | Source: Ambulatory Visit | Attending: Adult Health | Admitting: Adult Health

## 2019-11-21 ENCOUNTER — Other Ambulatory Visit: Payer: Self-pay

## 2019-11-21 DIAGNOSIS — M5442 Lumbago with sciatica, left side: Secondary | ICD-10-CM

## 2019-11-21 DIAGNOSIS — M5441 Lumbago with sciatica, right side: Secondary | ICD-10-CM

## 2019-12-06 ENCOUNTER — Other Ambulatory Visit: Payer: Self-pay

## 2019-12-06 ENCOUNTER — Encounter: Payer: Self-pay | Admitting: Urology

## 2019-12-06 ENCOUNTER — Ambulatory Visit (INDEPENDENT_AMBULATORY_CARE_PROVIDER_SITE_OTHER): Payer: Self-pay | Admitting: Urology

## 2019-12-06 VITALS — BP 131/79 | HR 69 | Temp 97.0°F | Ht 74.0 in | Wt 248.0 lb

## 2019-12-06 DIAGNOSIS — N521 Erectile dysfunction due to diseases classified elsewhere: Secondary | ICD-10-CM

## 2019-12-06 DIAGNOSIS — N400 Enlarged prostate without lower urinary tract symptoms: Secondary | ICD-10-CM

## 2019-12-06 LAB — POCT URINALYSIS DIPSTICK
Bilirubin, UA: NEGATIVE
Blood, UA: NEGATIVE
Glucose, UA: NEGATIVE
Ketones, UA: NEGATIVE
Leukocytes, UA: NEGATIVE
Nitrite, UA: NEGATIVE
Protein, UA: NEGATIVE
Spec Grav, UA: <=1.005 — AB (ref 1.010–1.025)
Urobilinogen, UA: NEGATIVE E.U./dL — AB
pH, UA: 6.5 (ref 5.0–8.0)

## 2019-12-06 NOTE — Progress Notes (Signed)
H&P  Chief Complaint: Here for prostate check  History of Present Illness: 64 year old male with BPH, on dual medical therapy, returns for follow-up.  There is a family history of prostate cancer, with his father dying from prostate cancer at the age of 13.  Last PSA recorded in our office was from July 2019 and was 1.2.  He is on finasteride and tamsulosin.  IPSS 19, quality-of-life score 2.  Most recent PSA was performed on July 27, 2019 and was 0.8.  Corrected for finasteride, 1.6.  He is also on sildenafil.  Past Medical History:  Diagnosis Date   Arthritis    BPH (benign prostatic hyperplasia)    Complication of anesthesia    Slow to awaken.  [name] very few mediations )   De Quervain's syndrome (tenosynovitis) 12/29/13   Left   Depression    situational    Past Surgical History:  Procedure Laterality Date   CERVICAL FUSION  2010   CERVICAL LAMINECTOMY  1990   COLONOSCOPY W/ POLYPECTOMY     LUMBAR LAMINECTOMY/DECOMPRESSION MICRODISCECTOMY N/A 03/08/2015   Procedure: Lumbar vertebral three to four, lumbar vertebral four to five lumbar decompression;  Surgeon: Aliene Beams, MD;  Location: [facility];  Service: Neurosurgery;  Laterality: N/A;   Male Fertility     NASAL SINUS SURGERY      Home Medications:  Allergies as of 12/06/2019   No Known Allergies     Medication List       Accurate as of December 06, 2019  8:45 AM. If you have any questions, ask your nurse or doctor.        finasteride 5 MG tablet Commonly known as: PROSCAR Take 1 tablet (5 mg total) by mouth daily.   HYDROcodone-acetaminophen 5-325 MG tablet Commonly known as: NORCO/VICODIN Take 1 tablet by mouth every 6 (six) hours as needed for moderate pain.   ibuprofen 600 MG tablet Commonly known as: ADVIL Take 600 mg by mouth every 8 (eight) hours as needed for moderate pain (600- 800).   Jalyn 0.5-0.4 MG Caps Generic drug: Dutasteride-Tamsulosin HCl Take 1 capsule by mouth daily.     multivitamin with minerals Tabs tablet Take 1 tablet by mouth daily.   oxymetazoline 0.05 % nasal spray Commonly known as: AFRIN Place 1 spray into both nostrils at bedtime.       Allergies: No Known Allergies  No family history on file.  Social History:  reports that he has never smoked. He has never used smokeless tobacco. He reports that he does not drink alcohol and does not use drugs.  ROS: A complete review of systems was performed.  All systems are negative except for pertinent findings as noted.  Physical Exam:  Vital signs in last 24 hours: There were no vitals taken for this visit. Constitutional:  Alert and oriented, No acute distress Cardiovascular: Regular rate  Respiratory: Normal respiratory effort GI: Abdomen is soft, nontender, nondistended, no abdominal masses. No CVAT.  Genitourinary: Normal male phallus, testes are descended bilaterally and non-tender and without masses, scrotum is normal in appearance without lesions or masses, perineum is normal on inspection.  Normal anal sphincter tone, gland 30 g in size, symmetrical. Lymphatic: No lymphadenopathy Neurologic: Grossly intact, no focal deficits Psychiatric: Normal mood and affect  I have reviewed prior pt notes  I have reviewed notes from referring/previous physicians  I have reviewed urinalysis results  I have reviewed prior PSA results  Impression/Assessment:  1.  BPH, moderate symptomatology but well-tolerated  by the patient.  He is on dual medical therapy.  2.  Prostate cancer screening, family history of prostate cancer.  PSA corrected for finasteride 1.6, normal  3.  ED, on sildenafil  Plan:  1.  He will continue on dual medical therapy  2.  He would like to be seen on an annual basis.  I think that is fine because of his family history.

## 2019-12-06 NOTE — Progress Notes (Signed)
Urological Symptom Review ° °Patient is experiencing the following symptoms: °Frequent urination °Hard to postpone urination °Get up at night to urinate °Weak stream ° ° °Review of Systems ° °Gastrointestinal (upper)  : °Negative for upper GI symptoms ° °Gastrointestinal (lower) : °Negative for lower GI symptoms ° °Constitutional : °Negative for symptoms ° °Skin: °Negative for skin symptoms ° °Eyes: °Negative for eye symptoms ° °Ear/Nose/Throat : °Negative for Ear/Nose/Throat symptoms ° °Hematologic/Lymphatic: °Negative for Hematologic/Lymphatic symptoms ° °Cardiovascular : °Negative for cardiovascular symptoms ° °Respiratory : °Negative for respiratory symptoms ° °Endocrine: °Negative for endocrine symptoms ° °Musculoskeletal: °Back pain °  °Neurological: °Negative for neurological symptoms ° °Psychologic: °Negative for psychiatric symptoms °

## 2019-12-29 ENCOUNTER — Ambulatory Visit
Admission: EM | Admit: 2019-12-29 | Discharge: 2019-12-29 | Disposition: A | Discharge status: Home / Self Care | Payer: Self-pay | Attending: Emergency Medicine | Admitting: Emergency Medicine

## 2019-12-29 ENCOUNTER — Other Ambulatory Visit: Payer: Self-pay

## 2019-12-29 DIAGNOSIS — L237 Allergic contact dermatitis due to plants, except food: Secondary | ICD-10-CM

## 2019-12-29 MED ORDER — PREDNISONE 10 MG (21) PO TBPK: ORAL_TABLET | ORAL | 0 refills | Status: DC

## 2019-12-29 MED ORDER — CETIRIZINE HCL 10 MG PO TABS: 10.0000 mg | ORAL_TABLET | Freq: Every day | ORAL | 0 refills | Status: DC

## 2019-12-29 NOTE — ED Provider Notes (Signed)
[facility]   [id number] 12/29/19 Arrival Time: 1448   Chief Complaint  Patient presents with  . Rash     SUBJECTIVE: History from: patient.  Geoffrey Andrews is a 64 y.o. male who presented to the urgent care with a complaint of rash that developed the past week and worsening the last 2 days.  Denies changes in soaps, detergents, or anyone with similar symptoms.  Localized the rash to his upper arm, back, abdomen and lower extremities.he describes it as red, itchy and spreading.  Has tried OTC calamine without relief.  Denies aggravating factors.  Denies similar symptoms in the past    ROS: As per HPI.  All other pertinent ROS negative.     Past Medical History:  Diagnosis Date  . Arthritis   . BPH (benign prostatic hyperplasia)   . Complication of anesthesia    Slow to awaken.  [name] very few mediations )  . De Quervain's syndrome (tenosynovitis) 12/29/13   Left  . Depression    situational   Past Surgical History:  Procedure Laterality Date  . CERVICAL FUSION  2010  . CERVICAL LAMINECTOMY  1990  . COLONOSCOPY W/ POLYPECTOMY    . LUMBAR LAMINECTOMY/DECOMPRESSION MICRODISCECTOMY N/A 03/08/2015   Procedure: Lumbar vertebral three to four, lumbar vertebral four to five lumbar decompression;  Surgeon: Aliene Beams, MD;  Location: [facility];  Service: Neurosurgery;  Laterality: N/A;  . Male Fertility    . NASAL SINUS SURGERY     No Known Allergies No current facility-administered medications on file prior to encounter.   Current Outpatient Medications on File Prior to Encounter  Medication Sig Dispense Refill  . Dutasteride-Tamsulosin HCl (JALYN) 0.5-0.4 MG CAPS Take 1 capsule by mouth daily.    . finasteride (PROSCAR) 5 MG tablet Take 1 tablet (5 mg total) by mouth daily. 30 tablet 0  . HYDROcodone-acetaminophen (NORCO/VICODIN) 5-325 MG tablet Take 1 tablet by mouth every 6 (six) hours as needed for moderate pain.    Marland Kitchen ibuprofen (ADVIL,MOTRIN) 600 MG tablet  Take 600 mg by mouth every 8 (eight) hours as needed for moderate pain (600- 800).    . Multiple Vitamin (MULTIVITAMIN WITH MINERALS) TABS tablet Take 1 tablet by mouth daily.    Marland Kitchen oxymetazoline (AFRIN) 0.05 % nasal spray Place 1 spray into both nostrils at bedtime.    . tamsulosin (FLOMAX) 0.4 MG CAPS capsule Take 0.4 mg by mouth daily.    . Vitamin D, Ergocalciferol, (DRISDOL) 1.25 MG (50000 UNIT) CAPS capsule Take 50,000 Units by mouth once a week.     Social History   Socioeconomic History  . Marital status: Married    Spouse name: Not on file  . Number of children: Not on file  . Years of education: Not on file  . Highest education level: Not on file  Occupational History  . Not on file  Tobacco Use  . Smoking status: Never Smoker  . Smokeless tobacco: Never Used  Substance and Sexual Activity  . Alcohol use: No    Alcohol/week: 0.0 standard drinks  . Drug use: No  . Sexual activity: Not on file  Other Topics Concern  . Not on file  Social History Narrative  . Not on file   Social Determinants of Health   Financial Resource Strain:   . Difficulty of Paying Living Expenses:   Food Insecurity:   . Worried About Programme researcher, broadcasting/film/video in the Last Year:   . [employer] of  Food in the Last Year:   Transportation Needs:   . Freight forwarder (Medical):   Marland Kitchen Lack of Transportation (Non-Medical):   Physical Activity:   . Days of Exercise per Week:   . Minutes of Exercise per Session:   Stress:   . Feeling of Stress :   Social Connections:   . Frequency of Communication with Friends and Family:   . Frequency of Social Gatherings with Friends and Family:   . Attends Religious Services:   . Active Member of Clubs or Organizations:   . Attends Banker Meetings:   Marland Kitchen Marital Status:   Intimate Partner Violence:   . Fear of Current or Ex-Partner:   . Emotionally Abused:   Marland Kitchen Physically Abused:   . Sexually Abused:    History reviewed. No pertinent family  history.  OBJECTIVE:  Vitals:   12/29/19 1458  BP: 118/80  Pulse: 89  Resp: 18  Temp: 98.3 F (36.8 C)  SpO2: 96%     Physical Exam Vitals and nursing note reviewed.  Constitutional:      General: He is not in acute distress.    Appearance: Normal appearance. He is normal weight. He is not ill-appearing, toxic-appearing or diaphoretic.  Cardiovascular:     Rate and Rhythm: Normal rate and regular rhythm.     Pulses: Normal pulses.     Heart sounds: Normal heart sounds. No murmur heard.  No friction rub. No gallop.   Pulmonary:     Effort: Pulmonary effort is normal. No respiratory distress.     Breath sounds: Normal breath sounds. No stridor. No wheezing, rhonchi or rales.  Chest:     Chest wall: No tenderness.  Skin:    General: Skin is warm.     Findings: Rash present. Rash is macular and vesicular.  Neurological:     Mental Status: He is alert and oriented to person, place, and time.      LABS:  No results found for this or any previous visit (from the past 24 hour(s)).   ASSESSMENT & PLAN:  1. Poison oak dermatitis     Meds ordered this encounter  Medications  . predniSONE (STERAPRED UNI-PAK 21 TAB) 10 MG (21) TBPK tablet    Sig: Take 6 tabs by mouth daily  for 1 days, then 5 tabs for 1 days, then 4 tabs for 1 days, then 3 tabs for 1 days, 2 tabs for 1 days, then 1 tab by mouth daily for 1 days    Dispense:  21 tablet    Refill:  0  . cetirizine (ZYRTEC ALLERGY) 10 MG tablet    Sig: Take 1 tablet (10 mg total) by mouth daily.    Dispense:  30 tablet    Refill:  0    Discharge Instructions  Prescribed zyrtec and prednisone take as prescribed and to completion Limit hot shower and baths, or bathe with warm water.   Moisturize skin daily Follow up with PCP if symptoms persists Return or go to the ER if you have any new or worsening symptoms  Reviewed expectations re: course of current medical issues. Questions answered. Outlined signs and symptoms  indicating need for more acute intervention. Patient verbalized understanding. After Visit Summary given.      Note: This document was prepared using Dragon voice recognition software and may include unintentional dictation errors.    Durward Parcel, FNP 12/29/19 1511

## 2019-12-29 NOTE — ED Triage Notes (Signed)
Pt presents with rash on legs, began almost a week ago and has worsened

## 2019-12-29 NOTE — Discharge Instructions (Addendum)
Prescribed zyrtec and prednisone  take as prescribed and to completion Limit hot shower and baths, or bathe with warm water.   Moisturize skin daily Follow up with PCP if symptoms persists Return or go to the ER if you have any new or worsening symptoms 

## 2020-01-18 ENCOUNTER — Other Ambulatory Visit: Payer: Self-pay

## 2020-01-18 MED ORDER — TAMSULOSIN HCL 0.4 MG PO CAPS: 0.4000 mg | ORAL_CAPSULE | Freq: Every day | ORAL | 11 refills | Status: DC

## 2020-02-15 ENCOUNTER — Other Ambulatory Visit: Payer: Self-pay

## 2020-02-15 DIAGNOSIS — N400 Enlarged prostate without lower urinary tract symptoms: Secondary | ICD-10-CM

## 2020-02-15 MED ORDER — FINASTERIDE 5 MG PO TABS: 5.0000 mg | ORAL_TABLET | Freq: Every day | ORAL | 11 refills | Status: DC

## 2020-02-16 ENCOUNTER — Other Ambulatory Visit: Payer: Self-pay | Admitting: Urology

## 2020-02-16 DIAGNOSIS — N521 Erectile dysfunction due to diseases classified elsewhere: Secondary | ICD-10-CM

## 2020-02-16 MED ORDER — SILDENAFIL CITRATE 100 MG PO TABS: 100.0000 mg | ORAL_TABLET | Freq: Every day | ORAL | 99 refills | Status: DC | PRN

## 2021-01-01 ENCOUNTER — Encounter: Payer: Self-pay | Admitting: Urology

## 2021-01-01 ENCOUNTER — Ambulatory Visit (INDEPENDENT_AMBULATORY_CARE_PROVIDER_SITE_OTHER): Payer: Medicare Other | Admitting: Urology

## 2021-01-01 ENCOUNTER — Other Ambulatory Visit: Payer: Self-pay

## 2021-01-01 VITALS — BP 128/85 | HR 79 | Ht 74.0 in | Wt 244.2 lb

## 2021-01-01 DIAGNOSIS — N400 Enlarged prostate without lower urinary tract symptoms: Secondary | ICD-10-CM | POA: Diagnosis not present

## 2021-01-01 DIAGNOSIS — N521 Erectile dysfunction due to diseases classified elsewhere: Secondary | ICD-10-CM | POA: Diagnosis not present

## 2021-01-01 LAB — URINALYSIS, ROUTINE W REFLEX MICROSCOPIC
Bilirubin, UA: NEGATIVE
Glucose, UA: NEGATIVE
Ketones, UA: NEGATIVE
Leukocytes,UA: NEGATIVE
Nitrite, UA: NEGATIVE
Protein,UA: NEGATIVE
RBC, UA: NEGATIVE
Specific Gravity, UA: 1.015 (ref 1.005–1.030)
Urobilinogen, Ur: 0.2 mg/dL (ref 0.2–1.0)
pH, UA: 7 (ref 5.0–7.5)

## 2021-01-01 NOTE — Progress Notes (Signed)
History of Present Illness: Here for followup of BPH as well as ED.    There is a family history of prostate cancer, with his father dying from prostate cancer at the age of 35.  Last PSA recorded in our office was from July 2019 and was 1.2.     8.9.2022: IPSS 4  QoL score 2. He is on finasteride and tamsulosin. Pt says that PSA has been stable.  His biggest issue is back pain which is being treated with epidural injections.   Past Medical History:  Diagnosis Date   Arthritis    BPH (benign prostatic hyperplasia)    Complication of anesthesia    Slow to awaken.  [name] very few mediations )   De Quervain's syndrome (tenosynovitis) 12/29/13   Left   Depression    situational    Past Surgical History:  Procedure Laterality Date   CERVICAL FUSION  2010   CERVICAL LAMINECTOMY  1990   COLONOSCOPY W/ POLYPECTOMY     LUMBAR LAMINECTOMY/DECOMPRESSION MICRODISCECTOMY N/A 03/08/2015   Procedure: Lumbar vertebral three to four, lumbar vertebral four to five lumbar decompression;  Surgeon: Aliene Beams, MD;  Location: [facility];  Service: Neurosurgery;  Laterality: N/A;   Male Fertility     NASAL SINUS SURGERY      Home Medications:  Allergies as of 01/01/2021   No Known Allergies      Medication List        Accurate as of January 01, 2021  8:17 AM. If you have any questions, ask your nurse or doctor.          cetirizine 10 MG tablet Commonly known as: ZyrTEC Allergy Take 1 tablet (10 mg total) by mouth daily.   finasteride 5 MG tablet Commonly known as: PROSCAR Take 1 tablet (5 mg total) by mouth daily.   HYDROcodone-acetaminophen 5-325 MG tablet Commonly known as: NORCO/VICODIN Take 1 tablet by mouth every 6 (six) hours as needed for moderate pain.   ibuprofen 600 MG tablet Commonly known as: ADVIL Take 600 mg by mouth every 8 (eight) hours as needed for moderate pain (600- 800).   Jalyn 0.5-0.4 MG Caps Generic drug: Dutasteride-Tamsulosin HCl Take 1 capsule by  mouth daily.   multivitamin with minerals Tabs tablet Take 1 tablet by mouth daily.   oxymetazoline 0.05 % nasal spray Commonly known as: AFRIN Place 1 spray into both nostrils at bedtime.   predniSONE 10 MG (21) Tbpk tablet Commonly known as: STERAPRED UNI-PAK 21 TAB Take 6 tabs by mouth daily  for 1 days, then 5 tabs for 1 days, then 4 tabs for 1 days, then 3 tabs for 1 days, 2 tabs for 1 days, then 1 tab by mouth daily for 1 days   sildenafil 100 MG tablet Commonly known as: VIAGRA Take 1 tablet (100 mg total) by mouth daily as needed for erectile dysfunction.   tamsulosin 0.4 MG Caps capsule Commonly known as: FLOMAX Take 1 capsule (0.4 mg total) by mouth daily.   Vitamin D (Ergocalciferol) 1.25 MG (50000 UNIT) Caps capsule Commonly known as: DRISDOL Take 50,000 Units by mouth once a week.        Allergies: No Known Allergies  No family history on file.  Social History:  reports that he has never smoked. He has never used smokeless tobacco. He reports that he does not drink alcohol and does not use drugs.  ROS: A complete review of systems was performed.  All systems are negative except  for pertinent findings as noted.  Physical Exam:  Vital signs in last 24 hours: There were no vitals taken for this visit. Constitutional:  Alert and oriented, No acute distress Cardiovascular: Regular rate  Respiratory: Normal respiratory effort GI: Abdomen is soft, nontender, nondistended, no abdominal masses. No CVAT.  Genitourinary: Normal male phallus, testes are descended bilaterally and non-tender and without masses, (mildly atrophic).  Scrotum is normal in appearance without lesions or masses, perineum is normal on inspection.  Prostate 40 mL, symmetrical, nonnodular, Lymphatic: No lymphadenopathy Neurologic: Grossly intact, no focal deficits Psychiatric: Normal mood and affect  I have reviewed prior pt notes  I have reviewed notes from referring/previous physicians  I  have reviewed urinalysis results  I have independently reviewed prior imaging  I have reviewed prior PSA results  I have reviewed prior urine culture   Impression/Assessment:  1.  Screening for prostate cancer with mild BPH, minimal urinary symptoms.  Apparently, normal PSA trend  2.  ED, on sildenafil  Plan:  1.  Continue dual medical therapy with finasteride/tamsulosin  2.  Continue PSA checks with his PCP  I will see back in 1 year

## 2021-01-01 NOTE — Progress Notes (Signed)
Urological Symptom Review  Patient is experiencing the following symptoms: Get up at night to urinate Leakage of urine Weak stream   Review of Systems  Gastrointestinal (upper)  : Negative for upper GI symptoms  Gastrointestinal (lower) : Negative for lower GI symptoms  Constitutional : Fatigue  Skin: Negative for skin symptoms  Eyes: Negative for eye symptoms  Ear/Nose/Throat : Sinus problems  Hematologic/Lymphatic: Negative for Hematologic/Lymphatic symptoms  Cardiovascular : Negative for cardiovascular symptoms  Respiratory : Negative for respiratory symptoms  Endocrine: Negative for endocrine symptoms  Musculoskeletal: Back pain  Neurological: Negative for neurological symptoms  Psychologic: Negative for psychiatric symptoms

## 2021-02-07 ENCOUNTER — Other Ambulatory Visit: Payer: Self-pay

## 2021-02-07 ENCOUNTER — Telehealth: Payer: Self-pay | Admitting: Urology

## 2021-02-07 DIAGNOSIS — N400 Enlarged prostate without lower urinary tract symptoms: Secondary | ICD-10-CM

## 2021-02-07 MED ORDER — TAMSULOSIN HCL 0.4 MG PO CAPS
0.4000 mg | ORAL_CAPSULE | Freq: Every day | ORAL | 11 refills | Status: DC
Start: 2021-02-07 — End: 2022-04-27

## 2021-02-07 MED ORDER — FINASTERIDE 5 MG PO TABS: 5.0000 mg | ORAL_TABLET | Freq: Every day | ORAL | 11 refills | Status: DC

## 2021-02-07 NOTE — Telephone Encounter (Signed)
Patient requesting a refill of tamsulosin to be sent to the [facility].   Patient was las seen on 01/01/2021 by Dr. Retta Diones and his office note says to continue tamsulosin.  [facility] pharmacy advised the patient that his request had been denied by the doctor's office.  Please advise.

## 2021-02-07 NOTE — Telephone Encounter (Signed)
Rx sent 

## 2021-08-13 ENCOUNTER — Other Ambulatory Visit: Payer: Self-pay | Admitting: Urology

## 2021-08-13 DIAGNOSIS — N521 Erectile dysfunction due to diseases classified elsewhere: Secondary | ICD-10-CM

## 2021-11-27 ENCOUNTER — Ambulatory Visit (HOSPITAL_COMMUNITY): Payer: Medicare Other | Attending: Neurosurgery | Admitting: Physical Therapy

## 2021-11-27 ENCOUNTER — Encounter (HOSPITAL_COMMUNITY): Payer: Self-pay | Admitting: Physical Therapy

## 2021-11-27 DIAGNOSIS — M5459 Other low back pain: Secondary | ICD-10-CM | POA: Insufficient documentation

## 2021-11-27 DIAGNOSIS — M6281 Muscle weakness (generalized): Secondary | ICD-10-CM | POA: Diagnosis present

## 2021-11-27 NOTE — Therapy (Signed)
OUTPATIENT PHYSICAL THERAPY THORACOLUMBAR EVALUATION   Patient Name: Geoffrey Andrews MRN: [id number] DOB:30-Nov-1955, 66 y.o., male Today's Date: 11/27/2021   PT End of Session - 11/27/21 1035     Visit Number 1    Number of Visits 8    Date for PT Re-Evaluation 12/25/21    Authorization Type BCBS Medicare    PT Start Time [phone]    PT Stop Time 1033    PT Time Calculation (min) 46 min    Activity Tolerance Patient tolerated treatment well    Behavior During Therapy WFL for tasks assessed/performed             Past Medical History:  Diagnosis Date   Arthritis    BPH (benign prostatic hyperplasia)    Complication of anesthesia    Slow to awaken.  [name] very few mediations )   De Quervain's syndrome (tenosynovitis) 12/29/13   Left   Depression    situational   Past Surgical History:  Procedure Laterality Date   CERVICAL FUSION  2010   CERVICAL LAMINECTOMY  1990   COLONOSCOPY W/ POLYPECTOMY     LUMBAR LAMINECTOMY/DECOMPRESSION MICRODISCECTOMY N/A 03/08/2015   Procedure: Lumbar vertebral three to four, lumbar vertebral four to five lumbar decompression;  Surgeon: Aliene Beams, MD;  Location: [facility];  Service: Neurosurgery;  Laterality: N/A;   Male Fertility     NASAL SINUS SURGERY     Patient Active Problem List   Diagnosis Date Noted   Lumbar stenosis with neurogenic claudication 03/08/2015   Tommi Rumps Quervain's disease (radial styloid tenosynovitis) 12/29/2013   SHOULDER PAIN 12/22/2007   DEGENERATIVE DISC DISEASE, CERVICAL SPINE 12/22/2007   IMPINGEMENT SYNDROME 12/22/2007    PCP: [facility]   REFERRING PROVIDER: Julio Sicks, MD   REFERRING DIAG: 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 He does reports history of lumbar issues, and previous lumbar issues. He had been very active until recent years due to this, but is noting increased fatigue in legs with walking. He had recent injection which has been helpful for reducing symptoms.   PERTINENT HISTORY:  Lumbar surgery (laminectomy 02/2015)   PAIN:  Are you having pain? No  PRECAUTIONS: Fall  WEIGHT BEARING RESTRICTIONS No  FALLS:  Has patient fallen in last 6 months? Yes. Number of falls 3-4  LIVING ENVIRONMENT: Lives with: lives with their family and lives with their spouse Lives in: House/apartment Stairs: No Has following equipment at home: Single point cane and Environmental consultant - 2 wheeled  OCCUPATION: Retired   PLOF: Independent  [place] able to get back to walking (couple miles with wife in the AM)    OBJECTIVE:   DIAGNOSTIC FINDINGS:   IMPRESSION: Lumbar spondylosis as outlined and most notably as follows.   At L3-L4, interval post laminectomy change since prior MRI 09/14/2014. Disc bulge with endplate spurring. New superimposed central disc protrusion with mild cranial migration. Progressive advanced facet arthrosis. 6 mm ventrally projecting synovial facet cyst on the left, also new from prior MRI. Moderate central canal stenosis has improved. However, there is persistent severe bilateral subarticular narrowing. The synovial facet cyst contributes to left subarticular narrowing, contacting and medially displacing the descending left L4 nerve root. Unchanged bilateral neural foraminal narrowing (moderate right, mild left).   At L4-L5, interval post laminectomy changes. Disc bulge with endplate spurring. A residual/recurrent central/right subarticular disc protrusion and moderate facet arthrosis  contribute to severe right subarticular stenosis, encroaching upon the descending right L5 nerve root. Mild left subarticular narrowing. Central canal patent. Progressive multifactorial moderate bilateral neural foraminal narrowing (greater on the right).   At L2-L3, an unchanged shallow left center/subarticular disc protrusion contributes to mild left subarticular narrowing with contact upon the descending left L3 nerve root. Mild relative central canal and bilateral neural foraminal narrowing.  PATIENT SURVEYS:  FOTO 50%    COGNITION:  Overall cognitive status: Within functional limits for tasks assessed     SENSATION: WFL    LUMBAR ROM:   Active  A/PROM  eval  Flexion 20% limited  Extension 80% limited   Right lateral flexion WFL  Left lateral flexion WFL  Right rotation WFL  Left rotation WFL   (Blank rows = not tested)  LOWER EXTREMITY ROM:      (Blank rows = not tested)  LOWER EXTREMITY MMT:    MMT Right eval Left eval  Hip flexion 5 4+  Hip extension 4 4  Hip abduction 4 4-  Hip adduction    Hip internal rotation    Hip external rotation    Knee flexion    Knee extension 4+ 4  Ankle dorsiflexion 4 4  Ankle plantarflexion    Ankle inversion    Ankle eversion     (Blank rows = not tested)  FUNCTIONAL TESTS:  5 times sit to stand: 11.67 sec with no UE  2 minute walk test: 475 feet no AD   GAIT:  Distance walked: 475 feet Assistive device utilized: None Level of assistance: Complete Independence Comments: bilat trendelenburg     TODAY'S TREATMENT  Eval Heel raise Mini squat with counter support    PATIENT EDUCATION:  Education details: On eval findings, POC and HEP  Person educated: Patient Education method: Explanation Education comprehension: verbalized understanding   HOME EXERCISE PROGRAM: Access Code: [email or web address] URL: [email or web address] Date: 11/27/2021 Prepared by: [place]  with Counter Support  - 2-3 x daily - 7 x weekly - 2 sets - 10 reps - Mini Squat with Counter Support  - 2-3 x daily - 7 x weekly - 2 sets - 10 reps  ASSESSMENT:  CLINICAL IMPRESSION: Patient is a 66 y.o. male who presents  to physical therapy with complaint of LBP/ BLE weakness . Patient demonstrates muscle weakness, reduced ROM, and decreased activity tolerance which are likely contributing to symptoms of pain and are negatively impacting patient ability to perform ADLs and functional mobility tasks. Patient will benefit from skilled physical therapy services to address these deficits to reduce pain and improve level of function with ADLs and functional mobility tasks.    OBJECTIVE IMPAIRMENTS Abnormal gait, decreased activity tolerance, decreased balance, decreased endurance, decreased mobility, decreased ROM, decreased strength, improper body mechanics, and pain.   ACTIVITY LIMITATIONS standing, squatting, stairs, transfers, and locomotion level  PARTICIPATION LIMITATIONS: shopping, community activity, and yard work  PERSONAL FACTORS Past/current experiences and Time since onset of injury/illness/exacerbation are also affecting patient's functional outcome.   REHAB POTENTIAL: Good  CLINICAL DECISION MAKING: Stable/uncomplicated  EVALUATION COMPLEXITY: Low   GOALS: SHORT TERM GOALS: Target date: 12/11/2021  Patient will be independent with initial HEP and self-management strategies to improve functional outcomes Baseline:  Goal status: INITIAL    LONG TERM GOALS: Target date: 12/25/2021  Patient will be independent with advanced HEP and self-management strategies to improve functional outcomes Baseline:  Goal status: INITIAL  2.  Patient will improve FOTO score to predicted value to indicate improvement in functional outcomes Baseline: 50% Goal status: INITIAL  3.  Patient will be able to perform stand x 5 in < 10 seconds to demonstrate improvement in functional mobility and  reduced risk for falls.   Baseline: 11.67 sec with no UEs Goal status: INITIAL  4. Patient will have equal to or > 4+/5 MMT throughout BLE to improve ability to perform functional mobility, stair ambulation and ADLs.  Baseline: See MMT Goal status: INITIAL  PLAN: PT FREQUENCY: 1-2x/week  PT DURATION: 4 weeks  PLANNED INTERVENTIONS: Therapeutic exercises, Therapeutic activity, Neuromuscular re-education, Balance training, Gait training, Patient/Family education, Joint manipulation, Joint mobilization, Stair training, Aquatic Therapy, Dry Needling, Electrical stimulation, Spinal manipulation, Spinal mobilization, Cryotherapy, Moist heat, scar mobilization, Taping, Traction, Ultrasound, Biofeedback, Ionotophoresis 4mg /ml Dexamethasone, and Manual therapy.  PLAN FOR NEXT SESSION: Progress functional strength >balance> gait and endurance   10:49 AM, 11/27/21 01/28/22 PT DPT  Physical Therapist with [facility]  [phone]

## 2021-11-28 ENCOUNTER — Ambulatory Visit (HOSPITAL_COMMUNITY): Payer: Medicare Other | Admitting: Physical Therapy

## 2021-11-28 DIAGNOSIS — M5459 Other low back pain: Secondary | ICD-10-CM

## 2021-11-28 DIAGNOSIS — M6281 Muscle weakness (generalized): Secondary | ICD-10-CM

## 2021-11-28 NOTE — Therapy (Signed)
OUTPATIENT PHYSICAL THERAPY TREATMENT  Patient Name: Geoffrey Andrews MRN: [id number] DOB:04/21/1956, 66 y.o., male Today's Date: 11/28/2021   PT End of Session - 11/28/21 1137     Visit Number 2    Number of Visits 8    Date for PT Re-Evaluation 12/25/21    Authorization Type BCBS Medicare    PT Start Time [phone]    PT Stop Time 1028    PT Time Calculation (min) 42 min    Activity Tolerance Patient tolerated treatment well    Behavior During Therapy WFL for tasks assessed/performed             Past Medical History:  Diagnosis Date   Arthritis    BPH (benign prostatic hyperplasia)    Complication of anesthesia    Slow to awaken.  [name] very few mediations )   De Quervain's syndrome (tenosynovitis) 12/29/13   Left   Depression    situational   Past Surgical History:  Procedure Laterality Date   CERVICAL FUSION  2010   CERVICAL LAMINECTOMY  1990   COLONOSCOPY W/ POLYPECTOMY     LUMBAR LAMINECTOMY/DECOMPRESSION MICRODISCECTOMY N/A 03/08/2015   Procedure: Lumbar vertebral three to four, lumbar vertebral four to five lumbar decompression;  Surgeon: Aliene Beams, MD;  Location: [facility];  Service: Neurosurgery;  Laterality: N/A;   Male Fertility     NASAL SINUS SURGERY     Patient Active Problem List   Diagnosis Date Noted   Lumbar stenosis with neurogenic claudication 03/08/2015   Tommi Rumps Quervain's disease (radial styloid tenosynovitis) 12/29/2013   SHOULDER PAIN 12/22/2007   DEGENERATIVE DISC DISEASE, CERVICAL SPINE 12/22/2007   IMPINGEMENT SYNDROME 12/22/2007    PCP: [facility]   REFERRING PROVIDER: Julio Sicks, MD   REFERRING DIAG: 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  Really not having any pain today.   PERTINENT HISTORY:  Lumbar surgery (laminectomy 02/2015)   PAIN:  Are you having pain? No   PRECAUTIONS: Fall  WEIGHT BEARING RESTRICTIONS No  FALLS:  Has patient fallen in last 6 months? Yes. Number of falls 3-4  LIVING ENVIRONMENT: Lives with: lives with their family and lives with their spouse Lives in: House/apartment  Stairs: No Has following equipment at home: Single point cane and Walker - 2 wheeled  OCCUPATION: Retired   PLOF: [place] able to get back to walking (couple miles with wife in the AM)    OBJECTIVE:   DIAGNOSTIC FINDINGS:   IMPRESSION: Lumbar spondylosis as outlined and most notably as follows.   At L3-L4, interval post laminectomy change since prior MRI 09/14/2014. Disc bulge with endplate spurring. New superimposed central disc protrusion with mild cranial migration. Progressive advanced facet arthrosis. 6 mm ventrally projecting synovial facet cyst on the left, also new from prior MRI. Moderate central canal stenosis has improved. However, there is persistent severe bilateral subarticular narrowing. The synovial facet cyst contributes to left subarticular narrowing, contacting and medially displacing the descending left L4 nerve root. Unchanged bilateral neural foraminal narrowing (moderate right, mild left).   At L4-L5, interval post laminectomy changes. Disc bulge with endplate spurring. A residual/recurrent central/right subarticular disc protrusion and moderate facet arthrosis contribute to severe right subarticular stenosis, encroaching upon the descending right L5 nerve root. Mild left subarticular narrowing. Central canal patent. Progressive multifactorial moderate bilateral neural foraminal narrowing (greater on the right).   At L2-L3,  an unchanged shallow left center/subarticular disc protrusion contributes to mild left subarticular narrowing with contact upon the descending left L3 nerve root. Mild relative central canal and bilateral neural foraminal narrowing.  PATIENT SURVEYS:  FOTO 50%    COGNITION:  Overall cognitive status: Within functional limits for tasks assessed     SENSATION: WFL    LUMBAR ROM:   Active  A/PROM  eval  Flexion 20% limited  Extension 80% limited   Right lateral flexion WFL  Left lateral flexion WFL  Right rotation WFL  Left rotation WFL   (Blank rows = not tested)  LOWER EXTREMITY ROM:      (Blank rows = not tested)  LOWER EXTREMITY MMT:    MMT Right eval Left eval  Hip flexion 5 4+  Hip extension 4 4  Hip abduction 4 4-  Hip adduction    Hip internal rotation    Hip external rotation    Knee flexion    Knee extension 4+ 4  Ankle dorsiflexion 4 4  Ankle plantarflexion    Ankle inversion    Ankle eversion     (Blank rows = not tested)  FUNCTIONAL TESTS:  5 times sit to stand: 11.67 sec with no UE  2 minute walk test: 475 feet no AD   GAIT:  Distance walked: 475 feet Assistive device utilized: None Level of assistance: Complete Independence Comments: bilat trendelenburg     TODAY'S TREATMENT  11/28/21 Standing:  Heelraises 15X AROM Sitting: Toeraises 15X AROM Supine: hamstring stretch 3X20" with towel  Bridge 10X AROM  Straight leg raise 10X each AROM Side lying: hip abduction AAROM for stab  Clams 10X5" each side AAROM for stab Prone: hamstring curls 10X AROM  Hip extension 10X AROM    Eval Heel raise Mini squat with counter support    PATIENT EDUCATION:  Education details: 7/6: goal and HEP review  evaluation: On eval findings, POC and HEP  Person educated: Patient Education method: Explanation Education comprehension: verbalized understanding   HOME EXERCISE PROGRAM: Access Code: [email or web address] URL:  [email or web address] Date: 11/27/2021 Prepared by: [place] with Counter Support  - 2-3 x daily - 7 x weekly - 2 sets - 10 reps - Mini Squat with Counter Support  - 2-3 x daily -  7 x weekly - 2 sets - 10 reps  ASSESSMENT:  CLINICAL IMPRESSION: Reviewed goals, HEP and POC moving forward.  Began with standing exercises; attempted to add toe raises, however unable to complete in standing without forward flexion.  Began with seated toe raises with much improved form/isolation.  AA required with side lying hip abduction due to extreme weakness and tendency to substitute with body. Patient will benefit from skilled physical therapy services to address these deficits to reduce pain and improve level of function with ADLs and functional mobility tasks.    OBJECTIVE IMPAIRMENTS Abnormal gait, decreased activity tolerance, decreased balance, decreased endurance, decreased mobility, decreased ROM, decreased strength, improper body mechanics, and pain.   ACTIVITY LIMITATIONS standing, squatting, stairs, transfers, and locomotion level  PARTICIPATION LIMITATIONS: shopping, community activity, and yard work  PERSONAL FACTORS Past/current experiences and Time since onset of injury/illness/exacerbation are also affecting patient's functional outcome.   REHAB POTENTIAL: Good  CLINICAL DECISION MAKING: Stable/uncomplicated  EVALUATION COMPLEXITY: Low   GOALS: SHORT TERM GOALS: Target date: 12/11/2021  Patient will be independent with initial HEP and self-management strategies to improve functional outcomes Baseline:  Goal status: IN PROGRESS    LONG TERM GOALS: Target date: 12/25/2021  Patient will be independent with advanced HEP and self-management strategies to improve functional outcomes Baseline:  Goal status: IN PROGRESS  2.  Patient will improve FOTO score to predicted value to indicate improvement in functional outcomes Baseline:  50% Goal status: IN PROGRESS  3.  Patient will be able to perform stand x 5 in < 10 seconds to demonstrate improvement in functional mobility and reduced risk for falls.   Baseline: 11.67 sec with no UEs Goal status: IN PROGRESS  4. Patient will have equal to or > 4+/5 MMT throughout BLE to improve ability to perform functional mobility, stair ambulation and ADLs.  Baseline: See MMT Goal status: IN PROGRESS  PLAN: PT FREQUENCY: 1-2x/week  PT DURATION: 4 weeks  PLANNED INTERVENTIONS: Therapeutic exercises, Therapeutic activity, Neuromuscular re-education, Balance training, Gait training, Patient/Family education, Joint manipulation, Joint mobilization, Stair training, Aquatic Therapy, Dry Needling, Electrical stimulation, Spinal manipulation, Spinal mobilization, Cryotherapy, Moist heat, scar mobilization, Taping, Traction, Ultrasound, Biofeedback, Ionotophoresis 4mg /ml Dexamethasone, and Manual therapy.   PLAN FOR NEXT SESSION: Progress functional strength >balance> gait and endurance   11:39 AM, 11/28/21 01/29/22, PTA/CLT [facility] [place] Ph: [phone]

## 2021-12-02 ENCOUNTER — Ambulatory Visit (HOSPITAL_COMMUNITY): Payer: Medicare Other | Admitting: Physical Therapy

## 2021-12-02 ENCOUNTER — Encounter (HOSPITAL_COMMUNITY): Payer: Self-pay | Admitting: Physical Therapy

## 2021-12-02 DIAGNOSIS — M5459 Other low back pain: Secondary | ICD-10-CM

## 2021-12-02 DIAGNOSIS — M6281 Muscle weakness (generalized): Secondary | ICD-10-CM

## 2021-12-02 NOTE — Therapy (Signed)
OUTPATIENT PHYSICAL THERAPY TREATMENT  Patient Name: Geoffrey Andrews MRN: [id number] DOB:[birth year], 66 y.o., male Today's Date: 12/02/2021   PT End of Session - 12/02/21 1307     Visit Number 3    Number of Visits 8    Date for PT Re-Evaluation 12/25/21    Authorization Type BCBS Medicare    PT Start Time 1308    PT Stop Time 1343    PT Time Calculation (min) 35 min    Activity Tolerance Patient tolerated treatment well    Behavior During Therapy WFL for tasks assessed/performed             Past Medical History:  Diagnosis Date   Arthritis    BPH (benign prostatic hyperplasia)    Complication of anesthesia    Slow to awaken.  York Pellant very few mediations )   De Quervain's syndrome (tenosynovitis) 12/29/13   Left   Depression    situational   Past Surgical History:  Procedure Laterality Date   CERVICAL FUSION  2010   CERVICAL LAMINECTOMY  1990   COLONOSCOPY W/ POLYPECTOMY     LUMBAR LAMINECTOMY/DECOMPRESSION MICRODISCECTOMY N/A 03/08/2015   Procedure: Lumbar vertebral three to four, lumbar vertebral four to five lumbar decompression;  Surgeon: Karie Chimera, MD;  Location: [facility];  Service: Neurosurgery;  Laterality: N/A;   Male Fertility     NASAL SINUS SURGERY     Patient Active Problem List   Diagnosis Date Noted   Lumbar stenosis with neurogenic claudication 03/08/2015   Tennis Must Quervain's disease (radial styloid tenosynovitis) 12/29/2013   SHOULDER PAIN 12/22/2007   DEGENERATIVE DISC DISEASE, CERVICAL SPINE 12/22/2007   IMPINGEMENT SYNDROME 12/22/2007    PCP: [facility]   REFERRING PROVIDER: Earnie Larsson, MD   REFERRING DIAG: 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   PERTINENT HISTORY:  Lumbar surgery (laminectomy 02/2015)  PAIN:  Are you having pain? Yes: NPRS scale: 2/10 Pain location: RT low lumbar Pain description: dull ache Aggravating factors: standing, walking on hard surface Relieving factors: rest    PRECAUTIONS: Fall  WEIGHT BEARING RESTRICTIONS No  FALLS:  Has patient fallen in last 6 months? Yes. Number of falls 3-4  LIVING ENVIRONMENT: Lives with: lives with their family and lives with their spouse Lives in: House/apartment Stairs: No Has following equipment at home: Single point cane and Environmental consultant - 2 wheeled  OCCUPATION: Retired   PLOF: Independent  [place] able to get back to walking (couple miles with wife in the AM)    OBJECTIVE:   DIAGNOSTIC FINDINGS:   IMPRESSION: Lumbar spondylosis as outlined and most notably as follows.   At L3-L4, interval post laminectomy change since prior MRI 09/14/2014. Disc bulge with endplate spurring. New superimposed central disc protrusion with mild cranial migration. Progressive advanced facet arthrosis. 6 mm ventrally projecting synovial facet cyst on the left, also new from prior MRI. Moderate central canal stenosis has improved. However, there is persistent severe bilateral subarticular narrowing. The synovial facet cyst contributes to left subarticular narrowing, contacting and medially displacing the descending left L4 nerve root. Unchanged bilateral neural foraminal narrowing (moderate right, mild left).   At L4-L5, interval post laminectomy changes. Disc bulge with endplate spurring. A residual/recurrent central/right subarticular disc protrusion and moderate facet arthrosis contribute to severe right subarticular stenosis, encroaching upon  the descending right L5 nerve root. Mild left subarticular narrowing. Central canal patent. Progressive multifactorial moderate bilateral neural foraminal narrowing (greater on the right).   At L2-L3, an unchanged shallow left center/subarticular disc protrusion contributes to mild left subarticular narrowing with contact upon the descending left L3 nerve root. Mild relative central canal and bilateral neural foraminal narrowing.  PATIENT SURVEYS:  FOTO 50%    COGNITION:  Overall cognitive status: Within functional limits for tasks assessed     SENSATION: WFL    LUMBAR ROM:   Active  A/PROM  eval  Flexion 20% limited  Extension 80% limited   Right lateral flexion WFL  Left lateral flexion WFL  Right rotation WFL  Left rotation WFL   (Blank rows = not tested)  LOWER EXTREMITY ROM:      (Blank rows = not tested)  LOWER EXTREMITY MMT:    MMT Right eval Left eval  Hip flexion 5 4+  Hip extension 4 4  Hip abduction 4 4-  Hip adduction    Hip internal rotation    Hip external rotation    Knee flexion    Knee extension 4+ 4  Ankle dorsiflexion 4 4  Ankle plantarflexion    Ankle inversion    Ankle eversion     (Blank rows = not tested)  FUNCTIONAL TESTS:  5 times sit to stand: 11.67 sec with no UE  2 minute walk test: 475 feet no AD   GAIT:  Distance walked: 475 feet Assistive device utilized: None Level of assistance: Complete Independence Comments: bilat trendelenburg     TODAY'S TREATMENT  12/02/21 Supine: Supine hamstring stretch 5 x 10" each  Bridge 15 x 5"   Side lying:    hip abduction 15 x 3" each   Standing:    Heel raises x20   Mini squats 2 x 10   Tandem stance (semi tandem) 3 x 20"   Step up 4 inch 2 x 10 each    Standing hip abduction (hard to keep balance with support)   SLS supported 3 x 10" holds using hand rail      11/28/21 Standing:  Heelraises 15X AROM Sitting: Toeraises 15X AROM Supine: hamstring stretch 3X20" with  towel  Bridge 10X AROM  Straight leg raise  10X each AROM Side lying: hip abduction AAROM for stab  Clams 10X5" each side AAROM for stab Prone: hamstring curls 10X AROM  Hip extension 10X AROM    Eval Heel raise Mini squat with counter support    PATIENT EDUCATION:  Education details: 7/6: goal and HEP review  evaluation: On eval findings, POC and HEP  Person educated: Patient Education method: Explanation Education comprehension: verbalized understanding   HOME EXERCISE PROGRAM: Access Code: [email or web address] URL: [email or web address]  12/02/21 - Sidelying Hip Abduction  - 2-3 x daily - 7 x weekly - 2 sets - 10 reps - Standing Tandem Balance with Counter Support  - 2-3 x daily - 7 x weekly - 1 sets - 3 reps - 20-30 second hold - Standing Single Leg Stance with Counter Support  - 2-3 x daily - 7 x weekly - 1 sets - 3 reps - 10-15 second hold  Date: 11/27/2021 Prepared by: Georges Lynch  Exercises - Heel Raises with Counter Support  - 2-3 x daily - 7 x weekly - 2 sets - 10 reps - Mini Squat with Counter Support  - 2-3 x daily - 7 x weekly - 2 sets - 10 reps  ASSESSMENT:  CLINICAL IMPRESSION: Progressed LE strength with added sidelying hip abduction. Patient cues on form and mechanics. Attempted to add in standing hip abduction but was too difficult to maintain the brief single leg stance. Added supported single leg stands to tolerance about 10 seconds). Good return with mini squats. Introduced balance work which was fairly Psychologist, clinical. Updated HEP and issued handout. Patient will continue to benefit from skilled therapy services to reduce remaining deficits and improve functional ability.     OBJECTIVE IMPAIRMENTS Abnormal gait, decreased activity tolerance, decreased balance, decreased endurance, decreased mobility, decreased ROM, decreased strength, improper body mechanics, and pain.   ACTIVITY LIMITATIONS standing, squatting, stairs, transfers, and locomotion  level  PARTICIPATION LIMITATIONS: shopping, community activity, and yard work  PERSONAL FACTORS Past/current experiences and Time since onset of injury/illness/exacerbation are also affecting patient's functional outcome.   REHAB POTENTIAL: Good  CLINICAL DECISION MAKING: Stable/uncomplicated  EVALUATION COMPLEXITY: Low   GOALS: SHORT TERM GOALS: Target date: 12/11/2021  Patient will be independent with initial HEP and self-management strategies to improve functional outcomes Baseline:  Goal status: IN PROGRESS    LONG TERM GOALS: Target date: 12/25/2021  Patient will be independent with advanced HEP and self-management strategies to improve functional outcomes Baseline:  Goal status: IN PROGRESS  2.  Patient will improve FOTO score to predicted value to indicate improvement in functional outcomes Baseline: 50% Goal status: IN PROGRESS  3.  Patient will be able to perform stand x 5 in < 10 seconds to demonstrate improvement in functional mobility and reduced risk for falls.   Baseline: 11.67 sec with no UEs Goal status: IN PROGRESS  4. Patient will have equal to or > 4+/5 MMT throughout BLE to improve ability to perform functional mobility, stair ambulation and ADLs.  Baseline: See MMT Goal status: IN PROGRESS  PLAN: PT FREQUENCY: 1-2x/week  PT DURATION: 4 weeks  PLANNED INTERVENTIONS: Therapeutic exercises, Therapeutic activity, Neuromuscular re-education, Balance training, Gait training, Patient/Family education, Joint manipulation, Joint mobilization, Stair training, Aquatic Therapy, Dry Needling, Electrical stimulation, Spinal manipulation, Spinal mobilization, Cryotherapy, Moist heat, scar mobilization, Taping, Traction, Ultrasound, Biofeedback, Ionotophoresis 4mg /ml Dexamethasone, and Manual therapy.   PLAN FOR NEXT SESSION: Progress functional strength >balance> gait and endurance   1:09 PM, 12/02/21 02/02/22 PT DPT  Physical Therapist  with [facility]   [facility]  [phone]

## 2021-12-06 ENCOUNTER — Encounter (HOSPITAL_COMMUNITY): Payer: Self-pay

## 2021-12-06 ENCOUNTER — Ambulatory Visit (HOSPITAL_COMMUNITY): Payer: Medicare Other

## 2021-12-06 DIAGNOSIS — M5459 Other low back pain: Secondary | ICD-10-CM

## 2021-12-06 DIAGNOSIS — M6281 Muscle weakness (generalized): Secondary | ICD-10-CM

## 2021-12-06 NOTE — Therapy (Signed)
OUTPATIENT PHYSICAL THERAPY TREATMENT  Patient Name: MARQUIZ SOTELO MRN: [id number] DOB:08-Sep-1955, 66 y.o., male Today's Date: 12/06/2021   PT End of Session - 12/06/21 0807     Visit Number 4    Number of Visits 8    Date for PT Re-Evaluation 12/25/21    Authorization Type BCBS Medicare    PT Start Time 0815    PT Stop Time 0855    PT Time Calculation (min) 40 min    Activity Tolerance Patient tolerated treatment well    Behavior During Therapy WFL for tasks assessed/performed             Past Medical History:  Diagnosis Date   Arthritis    BPH (benign prostatic hyperplasia)    Complication of anesthesia    Slow to awaken.  [name] very few mediations )   De Quervain's syndrome (tenosynovitis) 12/29/13   Left   Depression    situational   Past Surgical History:  Procedure Laterality Date   CERVICAL FUSION  2010   CERVICAL LAMINECTOMY  1990   COLONOSCOPY W/ POLYPECTOMY     LUMBAR LAMINECTOMY/DECOMPRESSION MICRODISCECTOMY N/A 03/08/2015   Procedure: Lumbar vertebral three to four, lumbar vertebral four to five lumbar decompression;  Surgeon: Aliene Beams, MD;  Location: [facility];  Service: Neurosurgery;  Laterality: N/A;   Male Fertility     NASAL SINUS SURGERY     Patient Active Problem List   Diagnosis Date Noted   Lumbar stenosis with neurogenic claudication 03/08/2015   Tommi Rumps Quervain's disease (radial styloid tenosynovitis) 12/29/2013   SHOULDER PAIN 12/22/2007   DEGENERATIVE DISC DISEASE, CERVICAL SPINE 12/22/2007   IMPINGEMENT SYNDROME 12/22/2007    PCP: [facility]   REFERRING PROVIDER: Julio Sicks, MD   REFERRING DIAG: 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 Back is feeling pretty good. Reports that knee pain has been ongoing for years, felt it last night giving some pain. Pt states PT at this time is prep for surgery, strenghtening.    PERTINENT HISTORY:  Lumbar surgery (laminectomy 02/2015)  PAIN:  Are you having pain? Yes: NPRS scale: 2/10 Pain location: RT low lumbar Pain description: dull ache Aggravating factors: standing, walking on hard surface Relieving factors: rest    PRECAUTIONS: Fall  WEIGHT BEARING RESTRICTIONS No  FALLS:  Has patient fallen in last 6 months? Yes. Number of falls 3-4  LIVING ENVIRONMENT: Lives with: lives with their family and lives with their spouse Lives in: House/apartment Stairs: No Has following equipment at home: Single point cane and Environmental consultant - 2 wheeled  OCCUPATION: Retired   PLOF: Independent  PATIENT GOALS BE able to get back to walking (couple miles with wife in the AM)    OBJECTIVE:   DIAGNOSTIC FINDINGS:   IMPRESSION: Lumbar spondylosis as outlined and most notably as follows.   At L3-L4, interval post laminectomy change since prior MRI 09/14/2014. Disc bulge with endplate spurring. New superimposed central disc protrusion with mild cranial migration. Progressive advanced facet arthrosis. 6 mm ventrally projecting synovial facet cyst on the left, also new from prior MRI. Moderate central canal stenosis has improved. However, there is persistent severe bilateral subarticular narrowing. The synovial facet cyst contributes to left subarticular narrowing, contacting and medially displacing the descending left L4 nerve root. Unchanged bilateral neural foraminal narrowing (moderate right, mild left).   At L4-L5, interval post laminectomy changes. Disc  bulge with endplate spurring. A residual/recurrent central/right subarticular disc protrusion and moderate facet arthrosis contribute to severe right subarticular stenosis, encroaching upon the descending right L5 nerve root. Mild left subarticular narrowing. Central canal patent. Progressive multifactorial moderate bilateral neural foraminal narrowing (greater on the right).   At L2-L3, an unchanged shallow left center/subarticular disc protrusion contributes to mild left subarticular narrowing with contact upon the descending left L3 nerve root. Mild relative central canal and bilateral neural foraminal narrowing.  PATIENT SURVEYS:  FOTO 50%    COGNITION:  Overall cognitive status: Within functional limits for tasks assessed     SENSATION: WFL    LUMBAR ROM:   Active  A/PROM  eval  Flexion 20% limited  Extension 80% limited   Right lateral flexion WFL  Left lateral flexion WFL  Right rotation WFL  Left rotation WFL   (Blank rows = not tested)  LOWER EXTREMITY ROM:      (Blank rows = not tested)  LOWER EXTREMITY MMT:    MMT Right eval Left eval  Hip flexion 5 4+  Hip extension 4 4  Hip abduction 4 4-  Hip adduction    Hip internal rotation    Hip external rotation    Knee flexion    Knee extension 4+ 4  Ankle dorsiflexion 4 4  Ankle plantarflexion    Ankle inversion    Ankle eversion     (Blank rows = not tested)  FUNCTIONAL TESTS:  5 times sit to stand: 11.67 sec with no UE  2 minute walk test: 475 feet no AD   GAIT:  Distance walked: 475 feet Assistive device utilized: None Level of assistance: Complete Independence Comments: bilat trendelenburg     TODAY'S TREATMENT  12/06/21 DKTC with eccentric lower x10 Single leg bridge x10  Clamshell with band red tbandx20 Prone hip extensions x15 b/l Supermans x15 Hamstring stretch with strap 10 sec x4 b/l Seated physioball forward roles x15 Bird dog x15 D1 lifts with b/l UE support Attempted  standing bird dog with bands Isometric standing march x10 b/l with uni UE support Modified single leg stance ball roll x10 rolls a/p each    12/02/21 Supine: Supine  hamstring stretch 5 x 10" each  Bridge 15 x 5"   Side lying:    hip abduction 15 x 3" each   Standing:    Heel raises x20   Mini squats 2 x 10   Tandem stance (semi tandem) 3 x 20"   Step up 4 inch 2 x 10 each    Standing hip abduction (hard to keep balance with support)   SLS supported 3 x 10" holds using hand rail      11/28/21 Standing:  Heelraises 15X AROM Sitting: Toeraises 15X AROM Supine: hamstring stretch 3X20" with towel  Bridge 10X AROM  Straight leg raise 10X each AROM Side lying: hip abduction AAROM for stab  Clams 10X5" each side AAROM for stab Prone: hamstring curls 10X AROM  Hip extension 10X AROM    Eval Heel raise Mini squat with counter support    PATIENT EDUCATION:  Education details: 7/14 HEP update 7/6: goal and HEP review  evaluation: On eval findings, POC and HEP  Person educated: Patient Education method: Explanation Education comprehension: verbalized understanding   HOME EXERCISE PROGRAM: Access Code: [email or web address] URL: [email or web address] Date: 12/06/2021 Prepared by: Aleatha Borer  Exercises - Supine Double Leg Lift  - 1 x daily - 7 x weekly - 3 sets - 10 reps - Figure 4 Bridge  - 1 x daily - 7 x weekly - 3 sets - 10 reps - Clam with Resistance  - 1 x daily - 7 x weekly - 3 sets - 10 reps - Prone Hip Extension Sequence  - 1 x daily - 7 x weekly - 3 sets - 10 reps - Full Superman on Table  - 1 x daily - 7 x weekly - 3 sets - 10 reps - Supine Hamstring Stretch with Strap  - 1 x daily - 7 x weekly - 1 sets - 14 reps - 10sec  hold - Child's Pose Stretch  - 1 x daily - 7 x weekly - 3 sets - 10 reps - Bird Dog  - 1 x daily - 7 x weekly - 3 sets - 10 reps - Standing Single Arm Shoulder PNF D1 Flexion with Anchored Resistance  - 1 x daily - 7 x weekly - 3 sets -  10 reps - Standing Marching  - 1 x daily - 7 x weekly - 2 sets - 10 reps - 5 sec  hold  Access Code: [email or web address] URL: [email or web address]  12/02/21 - Sidelying Hip Abduction  - 2-3 x daily - 7 x weekly - 2 sets - 10 reps - Standing Tandem Balance with Counter Support  - 2-3 x daily - 7 x weekly - 1 sets - 3 reps - 20-30 second hold - Standing Single Leg Stance with Counter Support  - 2-3 x daily - 7 x weekly - 1 sets - 3 reps - 10-15 second hold  Date: 11/27/2021 Prepared by: Georges Lynch  Exercises - Heel Raises with Counter Support  - 2-3 x daily - 7 x weekly - 2 sets - 10 reps - Mini Squat with Counter Support  - 2-3 x daily - 7 x weekly - 2 sets - 10 reps  ASSESSMENT:  CLINICAL IMPRESSION: Progressed to add in core strength and stretching. HEP updated, pt without question at this time. Pt reports that he feels back working during session but is overall ok. Patient with most difficulty with balance activities in narrow base and single leg support.  Patient will continue to benefit from skilled  therapy services to reduce remaining deficits and improve functional ability.     OBJECTIVE IMPAIRMENTS Abnormal gait, decreased activity tolerance, decreased balance, decreased endurance, decreased mobility, decreased ROM, decreased strength, improper body mechanics, and pain.   ACTIVITY LIMITATIONS standing, squatting, stairs, transfers, and locomotion level  PARTICIPATION LIMITATIONS: shopping, community activity, and yard work  PERSONAL FACTORS Past/current experiences and Time since onset of injury/illness/exacerbation are also affecting patient's functional outcome.   REHAB POTENTIAL: Good  CLINICAL DECISION MAKING: Stable/uncomplicated  EVALUATION COMPLEXITY: Low   GOALS: SHORT TERM GOALS: Target date: 12/11/2021  Patient will be independent with initial HEP and self-management strategies to improve functional outcomes Baseline:  Goal status: IN PROGRESS     LONG TERM GOALS: Target date: 12/25/2021  Patient will be independent with advanced HEP and self-management strategies to improve functional outcomes Baseline:  Goal status: IN PROGRESS  2.  Patient will improve FOTO score to predicted value to indicate improvement in functional outcomes Baseline: 50% Goal status: IN PROGRESS  3.  Patient will be able to perform stand x 5 in < 10 seconds to demonstrate improvement in functional mobility and reduced risk for falls.   Baseline: 11.67 sec with no UEs Goal status: IN PROGRESS  4. Patient will have equal to or > 4+/5 MMT throughout BLE to improve ability to perform functional mobility, stair ambulation and ADLs.  Baseline: See MMT Goal status: IN PROGRESS  PLAN: PT FREQUENCY: 1-2x/week  PT DURATION: 4 weeks  PLANNED INTERVENTIONS: Therapeutic exercises, Therapeutic activity, Neuromuscular re-education, Balance training, Gait training, Patient/Family education, Joint manipulation, Joint mobilization, Stair training, Aquatic Therapy, Dry Needling, Electrical stimulation, Spinal manipulation, Spinal mobilization, Cryotherapy, Moist heat, scar mobilization, Taping, Traction, Ultrasound, Biofeedback, Ionotophoresis 4mg /ml Dexamethasone, and Manual therapy.   PLAN FOR NEXT SESSION: Progress functional strength >balance> gait and endurance   11:17 AM, 12/06/21 Cornell Bourbon PT, DPT

## 2021-12-09 ENCOUNTER — Ambulatory Visit (HOSPITAL_COMMUNITY): Payer: Medicare Other | Admitting: Physical Therapy

## 2021-12-09 ENCOUNTER — Encounter (HOSPITAL_COMMUNITY): Payer: Self-pay | Admitting: Physical Therapy

## 2021-12-09 DIAGNOSIS — M6281 Muscle weakness (generalized): Secondary | ICD-10-CM

## 2021-12-09 DIAGNOSIS — M5459 Other low back pain: Secondary | ICD-10-CM

## 2021-12-09 NOTE — Therapy (Signed)
OUTPATIENT PHYSICAL THERAPY TREATMENT  Patient Name: Geoffrey Andrews MRN: [id number] DOB:February 06, 1956, 66 y.o., male Today's Date: 12/09/2021   PT End of Session - 12/09/21 1115     Visit Number 5    Number of Visits 8    Date for PT Re-Evaluation 12/25/21    Authorization Type BCBS Medicare    PT Start Time 1117    PT Stop Time 1155    PT Time Calculation (min) 38 min    Activity Tolerance Patient tolerated treatment well    Behavior During Therapy WFL for tasks assessed/performed             Past Medical History:  Diagnosis Date   Arthritis    BPH (benign prostatic hyperplasia)    Complication of anesthesia    Slow to awaken.  [name] very few mediations )   De Quervain's syndrome (tenosynovitis) 12/29/13   Left   Depression    situational   Past Surgical History:  Procedure Laterality Date   CERVICAL FUSION  2010   CERVICAL LAMINECTOMY  1990   COLONOSCOPY W/ POLYPECTOMY     LUMBAR LAMINECTOMY/DECOMPRESSION MICRODISCECTOMY N/A 03/08/2015   Procedure: Lumbar vertebral three to four, lumbar vertebral four to five lumbar decompression;  Surgeon: Aliene Beams, MD;  Location: [facility];  Service: Neurosurgery;  Laterality: N/A;   Male Fertility     NASAL SINUS SURGERY     Patient Active Problem List   Diagnosis Date Noted   Lumbar stenosis with neurogenic claudication 03/08/2015   Tommi Rumps Quervain's disease (radial styloid tenosynovitis) 12/29/2013   SHOULDER PAIN 12/22/2007   DEGENERATIVE DISC DISEASE, CERVICAL SPINE 12/22/2007   IMPINGEMENT SYNDROME 12/22/2007    PCP: [facility]   REFERRING PROVIDER: Julio Sicks, MD   REFERRING DIAG: 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 Did a lot of walking/ Standing on uneven ground this weekend. Had some back pain but "all in all not bad"   PERTINENT HISTORY:  Lumbar surgery (laminectomy 02/2015)  PAIN:  Are you having pain? No   PRECAUTIONS: Fall  WEIGHT BEARING RESTRICTIONS No  FALLS:  Has patient fallen in last 6 months? Yes. Number of falls 3-4  LIVING ENVIRONMENT: Lives with: lives with their family  and lives with their spouse Lives in: House/apartment Stairs: No Has following equipment at home: Single point cane and Environmental consultant - 2 wheeled  OCCUPATION: Retired   PLOF: Independent  PATIENT GOALS BE able to get back to walking (couple miles with wife in the AM)    OBJECTIVE:   DIAGNOSTIC FINDINGS:   IMPRESSION: Lumbar spondylosis as outlined and most notably as follows.   At L3-L4, interval post laminectomy change since prior MRI 09/14/2014. Disc bulge with endplate spurring. New superimposed central disc protrusion with mild cranial migration. Progressive advanced facet arthrosis. 6 mm ventrally projecting synovial facet cyst on the left, also new from prior MRI. Moderate central canal stenosis has improved. However, there is persistent severe bilateral subarticular narrowing. The synovial facet cyst contributes to left subarticular narrowing, contacting and medially displacing the descending left L4 nerve root. Unchanged bilateral neural foraminal narrowing (moderate right, mild left).   At L4-L5, interval post laminectomy changes. Disc bulge with endplate spurring. A residual/recurrent central/right subarticular disc protrusion and moderate facet arthrosis contribute to severe right subarticular stenosis, encroaching upon the descending right L5 nerve root. Mild left subarticular narrowing. Central canal patent. Progressive multifactorial moderate bilateral neural foraminal  narrowing (greater on the right).   At L2-L3, an unchanged shallow left center/subarticular disc protrusion contributes to mild left subarticular narrowing with contact upon the descending left L3 nerve root. Mild relative central canal and bilateral neural foraminal narrowing.  PATIENT SURVEYS:  FOTO 50%    COGNITION:  Overall cognitive status: Within functional limits for tasks assessed     SENSATION: WFL    LUMBAR ROM:   Active  A/PROM  eval  Flexion 20% limited  Extension 80% limited   Right lateral flexion WFL  Left lateral flexion WFL  Right rotation WFL  Left rotation WFL   (Blank rows = not tested)  LOWER EXTREMITY ROM:      (Blank rows = not tested)  LOWER EXTREMITY MMT:    MMT Right eval Left eval  Hip flexion 5 4+  Hip extension 4 4  Hip abduction 4 4-  Hip adduction    Hip internal rotation    Hip external rotation    Knee flexion    Knee extension 4+ 4  Ankle dorsiflexion 4 4  Ankle plantarflexion    Ankle inversion    Ankle eversion     (Blank rows = not tested)  FUNCTIONAL TESTS:  5 times sit to stand: 11.67 sec with no UE  2 minute walk test: 475 feet no AD   GAIT:  Distance walked: 475 feet Assistive device utilized: None Level of assistance: Complete Independence Comments: bilat trendelenburg     TODAY'S TREATMENT  12/09/21  Nu step (seat 11) lv 6 4 min Band rows BTB 2 x 15 Shoulder extension BTB 2 x 15 Palloff press BTB (single rail) 2 x 15 Standing bird dogs BTB x15 each Modified single leg stance ball roll x10 rolls a/p each   Chair squats 2 x 10   12/06/21 DKTC with eccentric lower x10 Single leg bridge x10  Clamshell with band red tbandx20 Prone hip extensions x15 b/l Supermans x15 Hamstring stretch with strap 10 sec x4 b/l Seated physioball forward roles x15 Bird dog x15 D1 lifts with b/l UE support Attempted standing bird dog with bands Isometric standing march x10 b/l with uni UE support Modified single  leg stance ball roll x10 rolls a/p each    12/02/21 Supine: Supine hamstring stretch 5 x 10" each  Bridge 15 x 5"   Side lying:    hip abduction 15 x 3" each   Standing:    Heel raises x20   Mini squats 2 x 10   Tandem stance (semi tandem) 3 x 20"   Step up 4 inch 2 x 10 each    Standing hip abduction (hard to keep balance with support)   SLS supported 3 x 10" holds using hand rail      11/28/21 Standing:  Heelraises 15X AROM Sitting: Toeraises 15X AROM Supine: hamstring stretch 3X20" with towel  Bridge 10X AROM  Straight leg raise 10X each AROM Side lying: hip abduction AAROM for stab  Clams 10X5" each side AAROM for stab Prone: hamstring curls 10X AROM  Hip extension 10X AROM    Eval Heel raise Mini squat with counter support    PATIENT EDUCATION:  Education details: 7/14 HEP update 7/6: goal and HEP review  evaluation: On eval findings, POC and HEP  Person educated: Patient Education method: Explanation Education comprehension: verbalized understanding   HOME EXERCISE PROGRAM: Access Code: [email or web address] URL: [email or web address]  12/09/21 - Standing Shoulder Row with Anchored Resistance  - 1 x daily - 7 x weekly - 3 sets - 10 reps - Shoulder extension with resistance - Neutral  - 1 x daily - 7 x weekly - 3 sets - 10 reps - Standing Anti-Rotation Press with Anchored Resistance  - 1 x daily - 7 x weekly - 3 sets - 10 reps  Date: 12/06/2021 Prepared by: Aleatha Borer  Exercises - Supine Double Leg Lift  - 1 x daily - 7 x weekly - 3 sets - 10 reps - Figure 4 Bridge  - 1 x daily - 7 x weekly - 3 sets - 10 reps - Clam with Resistance  - 1 x daily - 7 x weekly - 3 sets - 10 reps - Prone Hip Extension Sequence  - 1 x daily - 7 x weekly - 3 sets - 10 reps - Full Superman on Table  - 1 x daily - 7 x weekly - 3 sets - 10 reps - Supine Hamstring Stretch with Strap  - 1 x daily - 7 x weekly - 1 sets - 14 reps - 10sec  hold - Child's Pose Stretch  - 1 x  daily - 7 x weekly - 3 sets - 10 reps - Bird Dog  - 1 x daily - 7 x weekly - 3 sets - 10 reps - Standing Single Arm Shoulder PNF D1 Flexion with Anchored Resistance  - 1 x daily - 7 x weekly - 3 sets - 10 reps - Standing Marching  - 1 x daily - 7 x weekly - 2 sets - 10 reps - 5 sec  hold  Access Code: [email or web address] URL: [email or web address]  12/02/21 - Sidelying Hip Abduction  - 2-3 x daily - 7 x weekly - 2 sets - 10 reps - Standing Tandem Balance with Counter Support  - 2-3 x daily - 7 x weekly - 1 sets - 3 reps - 20-30 second hold - Standing Single Leg Stance with Counter Support  - 2-3 x daily - 7 x weekly - 1 sets - 3 reps - 10-15 second hold  Date: 11/27/2021 Prepared by: Georges Lynch  Exercises - Heel Raises with Counter Support  - 2-3 x daily - 7 x weekly - 2 sets - 10 reps - Mini Squat with Counter Support  - 2-3 x daily - 7 x weekly -  2 sets - 10 reps  ASSESSMENT:  CLINICAL IMPRESSION: Progressed core strengthening with added band rows and palloff pressing. Patient required cures for abdominal activation and posturing. Patient continues to be challenged with balance activity, requiring verbal cues and tactile input for improved form with standing birddogs. Patient noting increased muscle fatigue end of session. Patient will continue to benefit from skilled therapy services to reduce remaining deficits and improve functional ability.     OBJECTIVE IMPAIRMENTS Abnormal gait, decreased activity tolerance, decreased balance, decreased endurance, decreased mobility, decreased ROM, decreased strength, improper body mechanics, and pain.   ACTIVITY LIMITATIONS standing, squatting, stairs, transfers, and locomotion level  PARTICIPATION LIMITATIONS: shopping, community activity, and yard work  PERSONAL FACTORS Past/current experiences and Time since onset of injury/illness/exacerbation are also affecting patient's functional outcome.   REHAB POTENTIAL: Good  CLINICAL  DECISION MAKING: Stable/uncomplicated  EVALUATION COMPLEXITY: Low   GOALS: SHORT TERM GOALS: Target date: 12/11/2021  Patient will be independent with initial HEP and self-management strategies to improve functional outcomes Baseline:  Goal status: IN PROGRESS    LONG TERM GOALS: Target date: 12/25/2021  Patient will be independent with advanced HEP and self-management strategies to improve functional outcomes Baseline:  Goal status: IN PROGRESS  2.  Patient will improve FOTO score to predicted value to indicate improvement in functional outcomes Baseline: 50% Goal status: IN PROGRESS  3.  Patient will be able to perform stand x 5 in < 10 seconds to demonstrate improvement in functional mobility and reduced risk for falls.   Baseline: 11.67 sec with no UEs Goal status: IN PROGRESS  4. Patient will have equal to or > 4+/5 MMT throughout BLE to improve ability to perform functional mobility, stair ambulation and ADLs.  Baseline: See MMT Goal status: IN PROGRESS  PLAN: PT FREQUENCY: 1-2x/week  PT DURATION: 4 weeks  PLANNED INTERVENTIONS: Therapeutic exercises, Therapeutic activity, Neuromuscular re-education, Balance training, Gait training, Patient/Family education, Joint manipulation, Joint mobilization, Stair training, Aquatic Therapy, Dry Needling, Electrical stimulation, Spinal manipulation, Spinal mobilization, Cryotherapy, Moist heat, scar mobilization, Taping, Traction, Ultrasound, Biofeedback, Ionotophoresis 4mg /ml Dexamethasone, and Manual therapy.   PLAN FOR NEXT SESSION: Progress functional strength >balance> gait and endurance   11:16 AM, 12/09/21 12/11/21 PT DPT  Physical Therapist with [facility]  [phone]

## 2021-12-13 ENCOUNTER — Encounter (HOSPITAL_COMMUNITY): Payer: Self-pay

## 2021-12-13 ENCOUNTER — Ambulatory Visit (HOSPITAL_COMMUNITY): Payer: Medicare Other

## 2021-12-13 DIAGNOSIS — M5459 Other low back pain: Secondary | ICD-10-CM

## 2021-12-13 DIAGNOSIS — M6281 Muscle weakness (generalized): Secondary | ICD-10-CM

## 2021-12-13 NOTE — Therapy (Signed)
OUTPATIENT PHYSICAL THERAPY TREATMENT  Patient Name: Geoffrey Andrews MRN: [id number] DOB:06/06/1955, 66 y.o., male Today's Date: 12/13/2021   PT End of Session - 12/13/21 0732     Visit Number 6    Number of Visits 8    Date for PT Re-Evaluation 12/25/21    Authorization Type BCBS Medicare    PT Start Time 0732    PT Stop Time 0815    PT Time Calculation (min) 43 min    Activity Tolerance Patient tolerated treatment well    Behavior During Therapy WFL for tasks assessed/performed             Past Medical History:  Diagnosis Date   Arthritis    BPH (benign prostatic hyperplasia)    Complication of anesthesia    Slow to awaken.  [name] very few mediations )   De Quervain's syndrome (tenosynovitis) 12/29/13   Left   Depression    situational   Past Surgical History:  Procedure Laterality Date   CERVICAL FUSION  2010   CERVICAL LAMINECTOMY  1990   COLONOSCOPY W/ POLYPECTOMY     LUMBAR LAMINECTOMY/DECOMPRESSION MICRODISCECTOMY N/A 03/08/2015   Procedure: Lumbar vertebral three to four, lumbar vertebral four to five lumbar decompression;  Surgeon: Aliene Beams, MD;  Location: [facility];  Service: Neurosurgery;  Laterality: N/A;   Male Fertility     NASAL SINUS SURGERY     Patient Active Problem List   Diagnosis Date Noted   Lumbar stenosis with neurogenic claudication 03/08/2015   Tommi Rumps Quervain's disease (radial styloid tenosynovitis) 12/29/2013   SHOULDER PAIN 12/22/2007   DEGENERATIVE DISC DISEASE, CERVICAL SPINE 12/22/2007   IMPINGEMENT SYNDROME 12/22/2007    PCP: [facility]   REFERRING PROVIDER: Julio Sicks, MD   REFERRING DIAG: M43.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 Otherwise ready for PT.    PERTINENT HISTORY:  Lumbar surgery (laminectomy 02/2015)  PAIN:  Are you having pain? No   PRECAUTIONS: Fall  WEIGHT BEARING RESTRICTIONS No  FALLS:  Has patient fallen in last 6 months? Yes. Number of falls 3-4  LIVING ENVIRONMENT: Lives with: lives with their family and lives with their  spouse Lives in: House/apartment Stairs: No Has following equipment at home: Single point cane and Environmental consultant - 2 wheeled  OCCUPATION: Retired   PLOF: Independent  PATIENT GOALS BE able to get back to walking (couple miles with wife in the AM)    OBJECTIVE:   DIAGNOSTIC FINDINGS:   IMPRESSION: Lumbar spondylosis as outlined and most notably as follows.   At L3-L4, interval post laminectomy change since prior MRI 09/14/2014. Disc bulge with endplate spurring. New superimposed central disc protrusion with mild cranial migration. Progressive advanced facet arthrosis. 6 mm ventrally projecting synovial facet cyst on the left, also new from prior MRI. Moderate central canal stenosis has improved. However, there is persistent severe bilateral subarticular narrowing. The synovial facet cyst contributes to left subarticular narrowing, contacting and medially displacing the descending left L4 nerve root. Unchanged bilateral neural foraminal narrowing (moderate right, mild left).   At L4-L5, interval post laminectomy changes. Disc bulge with endplate spurring. A residual/recurrent central/right subarticular disc protrusion and moderate facet arthrosis contribute to severe right subarticular stenosis, encroaching upon the descending right L5 nerve root. Mild left subarticular narrowing. Central canal patent. Progressive multifactorial moderate bilateral neural foraminal narrowing (greater on  the right).   At L2-L3, an unchanged shallow left center/subarticular disc protrusion contributes to mild left subarticular narrowing with contact upon the descending left L3 nerve root. Mild relative central canal and bilateral neural foraminal narrowing.  PATIENT SURVEYS:  FOTO 50%    COGNITION:  Overall cognitive status: Within functional limits for tasks assessed     SENSATION: WFL    LUMBAR ROM:   Active  A/PROM  eval  Flexion 20% limited  Extension 80% limited   Right lateral flexion WFL  Left lateral flexion WFL  Right rotation WFL  Left rotation WFL   (Blank rows = not tested)  LOWER EXTREMITY ROM:      (Blank rows = not tested)  LOWER EXTREMITY MMT:    MMT Right eval Left eval  Hip flexion 5 4+  Hip extension 4 4  Hip abduction 4 4-  Hip adduction    Hip internal rotation    Hip external rotation    Knee flexion    Knee extension 4+ 4  Ankle dorsiflexion 4 4  Ankle plantarflexion    Ankle inversion    Ankle eversion     (Blank rows = not tested)  FUNCTIONAL TESTS:  5 times sit to stand: 11.67 sec with no UE  2 minute walk test: 475 feet no AD   GAIT:  Distance walked: 475 feet Assistive device utilized: None Level of assistance: Complete Independence Comments: bilat trendelenburg     TODAY'S TREATMENT  12/13/21 Lumbar trunk rotation with PB x30 Dktc with physio ball x15 Seated physionball forward roles x15 Cat camel x10 Bird dog x10 b/l Tandem holds Tandem walks 10' x4 Modified sls with basketball rolls x15 b/l 110# cable column shoulder ext with reactive core modified sls x10 b/l Level 1 step supported mini hip hinge with b/l UE support x10 each Modified sls form edge of mat x10 b/l     12/09/21  Nu step (seat 11) lv 6 4 min Band rows BTB 2 x 15 Shoulder extension BTB 2 x 15 Palloff press BTB (single rail) 2 x 15 Standing bird dogs BTB x15 each Modified single leg stance ball roll x10 rolls a/p each   Chair squats 2 x  10   12/06/21 DKTC with eccentric lower x10 Single leg bridge x10  Clamshell  with band red tbandx20 Prone hip extensions x15 b/l Supermans x15 Hamstring stretch with strap 10 sec x4 b/l Seated physioball forward roles x15 Bird dog x15 D1 lifts with b/l UE support Attempted standing bird dog with bands Isometric standing march x10 b/l with uni UE support Modified single leg stance ball roll x10 rolls a/p each    12/02/21 Supine: Supine hamstring stretch 5 x 10" each  Bridge 15 x 5"   Side lying:    hip abduction 15 x 3" each   Standing:    Heel raises x20   Mini squats 2 x 10   Tandem stance (semi tandem) 3 x 20"   Step up 4 inch 2 x 10 each    Standing hip abduction (hard to keep balance with support)   SLS supported 3 x 10" holds using hand rail      11/28/21 Standing:  Heelraises 15X AROM Sitting: Toeraises 15X AROM Supine: hamstring stretch 3X20" with towel  Bridge 10X AROM  Straight leg raise 10X each AROM Side lying: hip abduction AAROM for stab  Clams 10X5" each side AAROM for stab Prone: hamstring curls 10X AROM  Hip extension 10X AROM    Eval Heel raise Mini squat with counter support    PATIENT EDUCATION:  Education details: 7/14 HEP update 7/6: goal and HEP review  evaluation: On eval findings, POC and HEP  Person educated: Patient Education method: Explanation Education comprehension: verbalized understanding   HOME EXERCISE PROGRAM: Access Code: [email or web address] URL: [email or web address]  12/09/21 - Standing Shoulder Row with Anchored Resistance  - 1 x daily - 7 x weekly - 3 sets - 10 reps - Shoulder extension with resistance - Neutral  - 1 x daily - 7 x weekly - 3 sets - 10 reps - Standing Anti-Rotation Press with Anchored Resistance  - 1 x daily - 7 x weekly - 3 sets - 10 reps  Date: 12/06/2021 Prepared by: Aleatha Borer  Exercises - Supine Double Leg Lift  - 1 x daily - 7 x weekly - 3 sets - 10 reps - Figure 4 Bridge  - 1 x  daily - 7 x weekly - 3 sets - 10 reps - Clam with Resistance  - 1 x daily - 7 x weekly - 3 sets - 10 reps - Prone Hip Extension Sequence  - 1 x daily - 7 x weekly - 3 sets - 10 reps - Full Superman on Table  - 1 x daily - 7 x weekly - 3 sets - 10 reps - Supine Hamstring Stretch with Strap  - 1 x daily - 7 x weekly - 1 sets - 14 reps - 10sec  hold - Child's Pose Stretch  - 1 x daily - 7 x weekly - 3 sets - 10 reps - Bird Dog  - 1 x daily - 7 x weekly - 3 sets - 10 reps - Standing Single Arm Shoulder PNF D1 Flexion with Anchored Resistance  - 1 x daily - 7 x weekly - 3 sets - 10 reps - Standing Marching  - 1 x daily - 7 x weekly - 2 sets - 10 reps - 5 sec  hold  Access Code: [email or web address] URL: [email or web address]  12/02/21 - Sidelying Hip Abduction  - 2-3 x daily - 7 x weekly - 2 sets - 10 reps - Standing Tandem Balance with Counter Support  - 2-3 x daily - 7 x weekly - 1 sets - 3 reps - 20-30 second hold -  Standing Single Leg Stance with Counter Support  - 2-3 x daily - 7 x weekly - 1 sets - 3 reps - 10-15 second hold  Date: 11/27/2021 Prepared by: Georges Lynch  Exercises - Heel Raises with Counter Support  - 2-3 x daily - 7 x weekly - 2 sets - 10 reps - Mini Squat with Counter Support  - 2-3 x daily - 7 x weekly - 2 sets - 10 reps  ASSESSMENT:  CLINICAL IMPRESSION: Patient tolerates session well. Pt with improved tandem holds, tandem walks and modified sls. Pt with difficulty with modified sls and creating hip hinge on stance leg. Pt with improved trunk alignment and stability during balance exercises.. Patient will continue to benefit from skilled therapy services to reduce remaining deficits and improve functional ability.     OBJECTIVE IMPAIRMENTS Abnormal gait, decreased activity tolerance, decreased balance, decreased endurance, decreased mobility, decreased ROM, decreased strength, improper body mechanics, and pain.   ACTIVITY LIMITATIONS standing, squatting,  stairs, transfers, and locomotion level  PARTICIPATION LIMITATIONS: shopping, community activity, and yard work  PERSONAL FACTORS Past/current experiences and Time since onset of injury/illness/exacerbation are also affecting patient's functional outcome.   REHAB POTENTIAL: Good  CLINICAL DECISION MAKING: Stable/uncomplicated  EVALUATION COMPLEXITY: Low   GOALS: SHORT TERM GOALS: Target date: 12/11/2021  Patient will be independent with initial HEP and self-management strategies to improve functional outcomes Baseline:  Goal status: IN PROGRESS    LONG TERM GOALS: Target date: 12/25/2021  Patient will be independent with advanced HEP and self-management strategies to improve functional outcomes Baseline:  Goal status: IN PROGRESS  2.  Patient will improve FOTO score to predicted value to indicate improvement in functional outcomes Baseline: 50% Goal status: IN PROGRESS  3.  Patient will be able to perform stand x 5 in < 10 seconds to demonstrate improvement in functional mobility and reduced risk for falls.   Baseline: 11.67 sec with no UEs Goal status: IN PROGRESS  4. Patient will have equal to or > 4+/5 MMT throughout BLE to improve ability to perform functional mobility, stair ambulation and ADLs.  Baseline: See MMT Goal status: IN PROGRESS  PLAN: PT FREQUENCY: 1-2x/week  PT DURATION: 4 weeks  PLANNED INTERVENTIONS: Therapeutic exercises, Therapeutic activity, Neuromuscular re-education, Balance training, Gait training, Patient/Family education, Joint manipulation, Joint mobilization, Stair training, Aquatic Therapy, Dry Needling, Electrical stimulation, Spinal manipulation, Spinal mobilization, Cryotherapy, Moist heat, scar mobilization, Taping, Traction, Ultrasound, Biofeedback, Ionotophoresis 4mg /ml Dexamethasone, and Manual therapy.   PLAN FOR NEXT SESSION: Progress functional strength >balance> gait and endurance   8:43 AM, 12/13/21 Sana Tessmer PT, DPT

## 2021-12-17 ENCOUNTER — Ambulatory Visit (HOSPITAL_COMMUNITY): Payer: Medicare Other

## 2021-12-17 DIAGNOSIS — M5459 Other low back pain: Secondary | ICD-10-CM | POA: Diagnosis not present

## 2021-12-17 DIAGNOSIS — M6281 Muscle weakness (generalized): Secondary | ICD-10-CM

## 2021-12-17 NOTE — Therapy (Signed)
OUTPATIENT PHYSICAL THERAPY TREATMENT  Patient Name: Geoffrey Andrews MRN: [id number] DOB:[birth year], 66 y.o., male Today's Date: 12/17/2021   PT End of Session - 12/17/21 1436     Visit Number 7    Number of Visits 8    Date for PT Re-Evaluation 12/25/21    Authorization Type [employer] Medicare    PT Start Time 1435    PT Stop Time 1515    PT Time Calculation (min) 40 min    Activity Tolerance Patient tolerated treatment well    Behavior During Therapy WFL for tasks assessed/performed              Past Medical History:  Diagnosis Date   Arthritis    BPH (benign prostatic hyperplasia)    Complication of anesthesia    Slow to awaken.  [name] very few mediations )   De Quervain's syndrome (tenosynovitis) 12/29/13   Left   Depression    situational   Past Surgical History:  Procedure Laterality Date   CERVICAL FUSION  2010   CERVICAL LAMINECTOMY  1990   COLONOSCOPY W/ POLYPECTOMY     LUMBAR LAMINECTOMY/DECOMPRESSION MICRODISCECTOMY N/A 03/08/2015   Procedure: Lumbar vertebral three to four, lumbar vertebral four to five lumbar decompression;  Surgeon: Aliene Beams, MD;  Location: [facility];  Service: Neurosurgery;  Laterality: N/A;   Male Fertility     NASAL SINUS SURGERY     Patient Active Problem List   Diagnosis Date Noted   Lumbar stenosis with neurogenic claudication 03/08/2015   Tommi Rumps Quervain's disease (radial styloid tenosynovitis) 12/29/2013   SHOULDER PAIN 12/22/2007   DEGENERATIVE DISC DISEASE, CERVICAL SPINE 12/22/2007   IMPINGEMENT SYNDROME 12/22/2007    PCP: [facility]   REFERRING PROVIDER: Julio Sicks, MD   REFERRING DIAG: 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  PERTINENT HISTORY:  Lumbar surgery (laminectomy 02/2015)  PAIN:  Are you having pain? No   PRECAUTIONS: Fall  WEIGHT BEARING RESTRICTIONS No  FALLS:  Has patient fallen in last 6 months? Yes. Number of falls 3-4  LIVING ENVIRONMENT: Lives with: lives with their family and lives with their spouse Lives in: House/apartment Stairs: No Has following  equipment at home: Single point cane and Walker - 2 wheeled  OCCUPATION: Retired   PLOF: Independent  PATIENT GOALS BE able to get back to walking (couple miles with wife in the AM)    OBJECTIVE:   DIAGNOSTIC FINDINGS:   IMPRESSION: Lumbar spondylosis as outlined and most notably as follows.   At L3-L4, interval post laminectomy change since prior MRI 09/14/2014. Disc bulge with endplate spurring. New superimposed central disc protrusion with mild cranial migration. Progressive advanced facet arthrosis. 6 mm ventrally projecting synovial facet cyst on the left, also new from prior MRI. Moderate central canal stenosis has improved. However, there is persistent severe bilateral subarticular narrowing. The synovial facet cyst contributes to left subarticular narrowing, contacting and medially displacing the descending left L4 nerve root. Unchanged bilateral neural foraminal narrowing (moderate right, mild left).   At L4-L5, interval post laminectomy changes. Disc bulge with endplate spurring. A residual/recurrent central/right subarticular disc protrusion and moderate facet arthrosis contribute to severe right subarticular stenosis, encroaching upon the descending right L5 nerve root. Mild left subarticular narrowing. Central canal patent. Progressive multifactorial moderate bilateral neural foraminal narrowing (greater on the right).   At L2-L3, an  unchanged shallow left center/subarticular disc protrusion contributes to mild left subarticular narrowing with contact upon the descending left L3 nerve root. Mild relative central canal and bilateral neural foraminal narrowing.  PATIENT SURVEYS:  FOTO 50%    COGNITION:  Overall cognitive status: Within functional limits for tasks assessed     SENSATION: WFL    LUMBAR ROM:   Active  A/PROM  eval  Flexion 20% limited  Extension 80% limited   Right lateral flexion WFL  Left lateral flexion WFL  Right rotation WFL  Left rotation WFL   (Blank rows = not tested)  LOWER EXTREMITY ROM:      (Blank rows = not tested)  LOWER EXTREMITY MMT:    MMT Right eval Left eval  Hip flexion 5 4+  Hip extension 4 4  Hip abduction 4 4-  Hip adduction    Hip internal rotation    Hip external rotation    Knee flexion    Knee extension 4+ 4  Ankle dorsiflexion 4 4  Ankle plantarflexion    Ankle inversion    Ankle eversion     (Blank rows = not tested)  FUNCTIONAL TESTS:  5 times sit to stand: 11.67 sec with no UE  2 minute walk test: 475 feet no AD   GAIT:  Distance walked: 475 feet Assistive device utilized: None Level of assistance: Complete Independence Comments: bilat trendelenburg     TODAY'S TREATMENT  12/17/21 Nu step (seat 11) lv 4;  5 min  Supine: LTR x 10 DKTC with physioball ball x 15 Figure 4 stretch 5 x 10" bilaterally H/S stretch 5 x 20"  Dead bug with feet on mat to start x 10; trial of progression caused pain so d/c  Quadruped: Cat/camel x 10 Bird dog x 10  Standing: BTB palloff press 2 x 10 each         12/13/21 Lumbar trunk rotation with PB x30 Dktc with physio ball x15 Seated physionball forward roles x15 Cat camel x10 Bird dog x10 b/l Tandem holds Tandem walks 10' x4 Modified sls with basketball rolls x15 b/l 110# cable column shoulder ext with reactive core modified sls x10 b/l Level 1 step supported mini hip hinge with b/l  UE support x10 each Modified sls form edge of mat x10 b/l  12/09/21  Nu step (seat 11) lv 6 4 min Band rows BTB 2 x 15 Shoulder extension BTB 2 x 15 Palloff press BTB (single rail) 2 x 15 Standing bird dogs BTB x15 each Modified single leg stance ball roll x10 rolls a/p each   Chair squats 2 x 10   12/06/21 DKTC with eccentric lower x10 Single leg bridge x10  Clamshell with band red tbandx20 Prone hip extensions x15 b/l Supermans x15 Hamstring stretch with strap 10 sec x4 b/l Seated physioball forward roles x15 Bird dog x15 D1 lifts with b/l UE support Attempted standing bird dog with bands Isometric standing march x10 b/l with uni UE support Modified single leg stance ball roll x10 rolls a/p each    12/02/21 Supine: Supine hamstring stretch 5 x 10" each  Bridge 15 x 5"   Side lying:    hip abduction 15 x 3" each   Standing:    Heel raises x20   Mini squats 2 x 10   Tandem stance (semi tandem) 3 x 20"   Step up 4 inch 2 x 10 each    Standing hip abduction (hard to keep balance with support)   SLS supported 3 x 10" holds using hand rail      11/28/21 Standing:  Heelraises 15X AROM Sitting: Toeraises 15X AROM Supine: hamstring stretch 3X20" with towel  Bridge 10X AROM  Straight leg raise 10X each AROM Side lying: hip abduction AAROM for stab  Clams 10X5" each side AAROM for stab Prone: hamstring curls 10X AROM  Hip extension 10X AROM    Eval Heel raise Mini squat with counter support    PATIENT EDUCATION:  Education details: 7/14 HEP update 7/6: goal and HEP review  evaluation: On eval findings, POC and HEP  Person educated: Patient Education method: Explanation Education comprehension: verbalized understanding   HOME EXERCISE PROGRAM: Access Code: [email or web address] URL: [email or web address]  12/09/21 - Standing Shoulder Row with Anchored Resistance  - 1 x daily - 7 x weekly - 3 sets - 10 reps - Shoulder extension with resistance -  Neutral  - 1 x daily - 7 x weekly - 3 sets - 10 reps - Standing Anti-Rotation Press with Anchored Resistance  - 1 x daily - 7 x weekly - 3 sets - 10 reps  Date: 12/06/2021 Prepared by: Aleatha Borer  Exercises - Supine Double Leg Lift  - 1 x daily - 7 x weekly - 3 sets - 10 reps - Figure 4 Bridge  - 1 x daily - 7 x weekly - 3 sets - 10 reps - Clam with Resistance  - 1 x daily - 7 x weekly - 3 sets - 10 reps - Prone Hip Extension Sequence  - 1 x daily - 7 x weekly - 3 sets - 10 reps - Full Superman on Table  - 1 x daily - 7 x weekly - 3 sets - 10 reps - Supine Hamstring Stretch with Strap  - 1 x daily - 7 x weekly - 1 sets - 14 reps - 10sec  hold - Child's Pose Stretch  - 1 x daily - 7 x weekly - 3 sets - 10 reps - Bird Dog  - 1 x daily - 7 x weekly - 3 sets - 10 reps - Standing Single Arm Shoulder PNF D1 Flexion with Anchored Resistance  - 1 x daily - 7 x weekly - 3 sets - 10 reps - Standing Marching  - 1 x daily - 7  x weekly - 2 sets - 10 reps - 5 sec  hold  Access Code: [email or web address] URL: [email or web address]  12/02/21 - Sidelying Hip Abduction  - 2-3 x daily - 7 x weekly - 2 sets - 10 reps - Standing Tandem Balance with Counter Support  - 2-3 x daily - 7 x weekly - 1 sets - 3 reps - 20-30 second hold - Standing Single Leg Stance with Counter Support  - 2-3 x daily - 7 x weekly - 1 sets - 3 reps - 10-15 second hold  Date: 11/27/2021 Prepared by: Georges Keighan Amezcua  Exercises - Heel Raises with Counter Support  - 2-3 x daily - 7 x weekly - 2 sets - 10 reps - Mini Squat with Counter Support  - 2-3 x daily - 7 x weekly - 2 sets - 10 reps  ASSESSMENT:  CLINICAL IMPRESSION: Today's session focused on lumbar mobility and core strengthening. Added dead bug; trial of progressed dead but but caused pain so d/c; patient needs cues for keeping core tight with stabilization exercises; needs tactile cues with bird dog to avoid hip rotation. Patient will continue to benefit from  skilled therapy services to reduce remaining deficits and improve functional ability.     OBJECTIVE IMPAIRMENTS Abnormal gait, decreased activity tolerance, decreased balance, decreased endurance, decreased mobility, decreased ROM, decreased strength, improper body mechanics, and pain.   ACTIVITY LIMITATIONS standing, squatting, stairs, transfers, and locomotion level  PARTICIPATION LIMITATIONS: shopping, community activity, and yard work  PERSONAL FACTORS Past/current experiences and Time since onset of injury/illness/exacerbation are also affecting patient's functional outcome.   REHAB POTENTIAL: Good  CLINICAL DECISION MAKING: Stable/uncomplicated  EVALUATION COMPLEXITY: Low   GOALS: SHORT TERM GOALS: Target date: 12/11/2021  Patient will be independent with initial HEP and self-management strategies to improve functional outcomes Baseline:  Goal status: IN PROGRESS    LONG TERM GOALS: Target date: 12/25/2021  Patient will be independent with advanced HEP and self-management strategies to improve functional outcomes Baseline:  Goal status: IN PROGRESS  2.  Patient will improve FOTO score to predicted value to indicate improvement in functional outcomes Baseline: 50% Goal status: IN PROGRESS  3.  Patient will be able to perform stand x 5 in < 10 seconds to demonstrate improvement in functional mobility and reduced risk for falls.   Baseline: 11.67 sec with no UEs Goal status: IN PROGRESS  4. Patient will have equal to or > 4+/5 MMT throughout BLE to improve ability to perform functional mobility, stair ambulation and ADLs.  Baseline: See MMT Goal status: IN PROGRESS  PLAN: PT FREQUENCY: 1-2x/week  PT DURATION: 4 weeks  PLANNED INTERVENTIONS: Therapeutic exercises, Therapeutic activity, Neuromuscular re-education, Balance training, Gait training, Patient/Family education, Joint manipulation, Joint mobilization, Stair training, Aquatic Therapy, Dry Needling, Electrical  stimulation, Spinal manipulation, Spinal mobilization, Cryotherapy, Moist heat, scar mobilization, Taping, Traction, Ultrasound, Biofeedback, Ionotophoresis 4mg /ml Dexamethasone, and Manual therapy.   PLAN FOR NEXT SESSION: Progress functional strength >balance> gait and endurance ; reassess next visit  3:20 PM, 12/17/21 Traevon Meiring Small Oley Lahaie MPT [facility] [place] [phone]

## 2021-12-18 ENCOUNTER — Encounter (HOSPITAL_COMMUNITY): Payer: Medicare Other | Admitting: Physical Therapy

## 2021-12-23 ENCOUNTER — Ambulatory Visit (HOSPITAL_COMMUNITY): Payer: Medicare Other | Admitting: Physical Therapy

## 2021-12-23 DIAGNOSIS — M6281 Muscle weakness (generalized): Secondary | ICD-10-CM

## 2021-12-23 DIAGNOSIS — M5459 Other low back pain: Secondary | ICD-10-CM

## 2021-12-23 NOTE — Therapy (Addendum)
OUTPATIENT PHYSICAL THERAPY TREATMENT Discharge  PHYSICAL THERAPY DISCHARGE SUMMARY  Visits from Start of Care: 8  Current functional level related to goals / functional outcomes: See below   Remaining deficits: See below   Education / Equipment: See below   Patient agrees to discharge. Patient goals were met. Patient is being discharged due to meeting the stated rehab goals.   Patient Name: Geoffrey Andrews MRN: [id number] DOB:1955-07-01, 66 y.o., male Today's Date: 12/23/2021   PT End of Session - 12/23/21 4174     Visit Number 8    Number of Visits 8    Date for PT Re-Evaluation 12/25/21    Authorization Type BCBS Medicare    PT Start Time 0820    PT Stop Time 0850    PT Time Calculation (min) 30 min    Activity Tolerance Patient tolerated treatment well    Behavior During Therapy WFL for tasks assessed/performed              Past Medical History:  Diagnosis Date   Arthritis    BPH (benign prostatic hyperplasia)    Complication of anesthesia    Slow to awaken.  York Pellant very few mediations )   De Quervain's syndrome (tenosynovitis) 12/29/13   Left   Depression    situational   Past Surgical History:  Procedure Laterality Date   CERVICAL FUSION  2010   CERVICAL LAMINECTOMY  1990   COLONOSCOPY W/ POLYPECTOMY     LUMBAR LAMINECTOMY/DECOMPRESSION MICRODISCECTOMY N/A 03/08/2015   Procedure: Lumbar vertebral three to four, lumbar vertebral four to five lumbar decompression;  Surgeon: Karie Chimera, MD;  Location: [facility];  Service: Neurosurgery;  Laterality: N/A;   Male Fertility     NASAL SINUS SURGERY     Patient Active Problem List   Diagnosis Date Noted   Lumbar stenosis with neurogenic claudication 03/08/2015   Tennis Must Quervain's disease (radial styloid tenosynovitis) 12/29/2013   SHOULDER PAIN 12/22/2007   DEGENERATIVE DISC DISEASE, CERVICAL SPINE 12/22/2007   IMPINGEMENT SYNDROME 12/22/2007    PCP: [facility]   REFERRING PROVIDER: Earnie Larsson, MD   REFERRING DIAG: M43.10 degenerative spondylolisthesis   Rationale for Evaluation and Treatment Rehabilitation  THERAPY DIAG:  Other low back pain  Muscle weakness (generalized)  ONSET DATE: Several years (chronic)   SUBJECTIVE:                                                                                                                                                                                           SUBJECTIVE STATEMENT: Patient states he is moving better and can tell he is stronger.  States he still is not where he wants to be but feels he has the tools to get there.  Currently without pain or issues.  Attending the [employer] regularly and walking with his spouse.  PERTINENT HISTORY:  Lumbar surgery (laminectomy 02/2015)  PAIN:  Are you having pain? No   PRECAUTIONS: Fall  WEIGHT BEARING RESTRICTIONS No  FALLS:  Has patient fallen in last 6 months? Yes. Number of falls 3-4  LIVING ENVIRONMENT: Lives with: lives with their family and lives with their spouse Lives in: House/apartment Stairs: No Has following equipment at home: Single point cane and Environmental consultant - 2 wheeled  OCCUPATION: Retired   PLOF: Independent  Lorena able to get back to walking (couple miles with wife in the AM)    OBJECTIVE:   DIAGNOSTIC FINDINGS:   IMPRESSION: Lumbar spondylosis as outlined and most notably as follows.   At L3-L4, interval post laminectomy change since prior MRI 09/14/2014. Disc bulge with endplate spurring. New superimposed central disc protrusion with mild cranial migration. Progressive advanced facet arthrosis. 6 mm ventrally projecting synovial facet cyst on the left, also new from prior MRI. Moderate central canal stenosis has improved. However, there is persistent severe bilateral subarticular narrowing. The synovial facet cyst contributes to left subarticular narrowing, contacting and medially displacing the descending left L4 nerve root.  Unchanged bilateral neural foraminal narrowing (moderate right, mild left).   At L4-L5, interval post laminectomy changes. Disc bulge with endplate spurring. A residual/recurrent central/right subarticular disc protrusion and moderate facet arthrosis contribute to severe right subarticular stenosis, encroaching upon the descending right L5 nerve root. Mild left subarticular narrowing. Central canal patent. Progressive multifactorial moderate bilateral neural foraminal narrowing (greater on the right).   At L2-L3, an unchanged shallow left center/subarticular disc protrusion contributes to mild left subarticular narrowing with contact upon the descending left L3 nerve root. Mild relative central canal and bilateral neural foraminal narrowing.  PATIENT SURVEYS:  FOTO 12/23/21: 61% functional status (was 50% FS) predictive value 59%    COGNITION:  Overall cognitive status: Within functional limits for tasks assessed     SENSATION: [facility]    LUMBAR ROM:   Active  A/PROM  eval 12/23/21  Flexion 20% limited WFL No pain  Extension 80% limited  WFL No pain  Right lateral flexion WFL   Left lateral flexion WFL   Right rotation WFL   Left rotation WFL    (Blank rows = not tested)  LOWER EXTREMITY ROM:      (Blank rows = not tested)  LOWER EXTREMITY MMT:    MMT Right eval Right  12/23/21 Left eval Left 12/23/21  Hip flexion 5 5 4+ 5  Hip extension 4 4+ 4 4+  Hip abduction 4 4+ 4- 4  Hip adduction      Hip internal rotation      Hip external rotation      Knee flexion      Knee extension 4+ _0 Ankle dorsiflexion 4 4+ 4 4+  Ankle plantarflexion      Ankle inversion      Ankle eversion       (Blank rows = not tested)  FUNCTIONAL TESTS:  5 times sit to stand: 12/23/21:  8.01 sec on UE (was 11.67 sec with no UE)  2 minute walk test: 475 feet no AD   GAIT:  Distance walked: 475 feet Assistive device utilized: None Level of assistance: Complete  Independence Comments: bilat trendelenburg  TODAY'S TREATMENT  12/23/21 Reassessment HEP review  12/17/21 Nu step (seat 11) lv 4;  5 min Supine: LTR x 10 DKTC with physioball ball x 15 Figure 4 stretch 5 x 10" bilaterally H/S stretch 5 x 20"  Dead bug with feet on mat to start x 10; trial of progression caused pain so d/c Quadruped: Cat/camel x 10 Bird dog x 10 Standing: BTB palloff press 2 x 10 each  12/13/21 Lumbar trunk rotation with PB x30 Dktc with physio ball x15 Seated physionball forward roles x15 Cat camel x10 Bird dog x10 b/l Tandem holds Tandem walks 10' x4 Modified sls with basketball rolls x15 b/l 110# cable column shoulder ext with reactive core modified sls x10 b/l Level 1 step supported mini hip hinge with b/l UE support x10 each Modified sls form edge of mat x10 b/l  12/09/21  Nu step (seat 11) lv 6 4 min Band rows BTB 2 x 15 Shoulder extension BTB 2 x 15 Palloff press BTB (single rail) 2 x 15 Standing bird dogs BTB x15 each Modified single leg stance ball roll x10 rolls a/p each   Chair squats 2 x 10   12/06/21 DKTC with eccentric lower x10 Single leg bridge x10  Clamshell with band red tbandx20 Prone hip extensions x15 b/l Supermans x15 Hamstring stretch with strap 10 sec x4 b/l Seated physioball forward roles x15 Bird dog x15 D1 lifts with b/l UE support Attempted standing bird dog with bands Isometric standing march x10 b/l with uni UE support Modified single leg stance ball roll x10 rolls a/p each    12/02/21 Supine: Supine hamstring stretch 5 x 10" each  Bridge 15 x 5"   Side lying:    hip abduction 15 x 3" each   Standing:    Heel raises x20   Mini squats 2 x 10   Tandem stance (semi tandem) 3 x 20"   Step up 4 inch 2 x 10 each    Standing hip abduction (hard to keep balance with support)   SLS supported 3 x 10" holds using hand rail      11/28/21 Standing:  Heelraises 15X AROM Sitting: Toeraises 15X AROM Supine:  hamstring stretch 3X20" with towel  Bridge 10X AROM  Straight leg raise 10X each AROM Side lying: hip abduction AAROM for stab  Clams 10X5" each side AAROM for stab Prone: hamstring curls 10X AROM  Hip extension 10X AROM    Eval Heel raise Mini squat with counter support    PATIENT EDUCATION:  Education details: 7/14 HEP update 7/6: goal and HEP review  evaluation: On eval findings, POC and HEP  Person educated: Patient Education method: Explanation Education comprehension: verbalized understanding   HOME EXERCISE PROGRAM: Access Code: [email or web address] URL: [email or web address]  12/09/21 - Standing Shoulder Row with Anchored Resistance  - 1 x daily - 7 x weekly - 3 sets - 10 reps - Shoulder extension with resistance - Neutral  - 1 x daily - 7 x weekly - 3 sets - 10 reps - Standing Anti-Rotation Press with Anchored Resistance  - 1 x daily - 7 x weekly - 3 sets - 10 reps  Date: 12/06/2021 Prepared by: Leota Jacobsen  Exercises - Supine Double Leg Lift  - 1 x daily - 7 x weekly - 3 sets - 10 reps - Figure 4 Bridge  - 1 x daily - 7 x weekly - 3 sets - 10 reps - Clam with Resistance  - 1 x daily - 7  x weekly - 3 sets - 10 reps - Prone Hip Extension Sequence  - 1 x daily - 7 x weekly - 3 sets - 10 reps - Full Superman on Table  - 1 x daily - 7 x weekly - 3 sets - 10 reps - Supine Hamstring Stretch with Strap  - 1 x daily - 7 x weekly - 1 sets - 14 reps - 10sec  hold - Child's Pose Stretch  - 1 x daily - 7 x weekly - 3 sets - 10 reps - Bird Dog  - 1 x daily - 7 x weekly - 3 sets - 10 reps - Standing Single Arm Shoulder PNF D1 Flexion with Anchored Resistance  - 1 x daily - 7 x weekly - 3 sets - 10 reps - Standing Marching  - 1 x daily - 7 x weekly - 2 sets - 10 reps - 5 sec  hold  Access Code: [email or web address] URL: [email or web address]  12/02/21 - Sidelying Hip Abduction  - 2-3 x daily - 7 x weekly - 2 sets - 10 reps - Standing Tandem Balance with Counter  Support  - 2-3 x daily - 7 x weekly - 1 sets - 3 reps - 20-30 second hold - Standing Single Leg Stance with Counter Support  - 2-3 x daily - 7 x weekly - 1 sets - 3 reps - 10-15 second hold  Date: 11/27/2021 Prepared by: [place] with Counter Support  - 2-3 x daily - 7 x weekly - 2 sets - 10 reps - Mini Squat with Counter Support  - 2-3 x daily - 7 x weekly - 2 sets - 10 reps  ASSESSMENT:  CLINICAL IMPRESSION: All measures retested from initial evaluation this session.  Pt has met all goals and is independent with HEP.  Pt is attending the [employer] regularly for aquatics and walking with spouse.  Pt feels confident to continue independently at this point.  No further skilled need and ready for discharge.   OBJECTIVE IMPAIRMENTS Abnormal gait, decreased activity tolerance, decreased balance, decreased endurance, decreased mobility, decreased ROM, decreased strength, improper body mechanics, and pain.   ACTIVITY LIMITATIONS standing, squatting, stairs, transfers, and locomotion level  PARTICIPATION LIMITATIONS: shopping, community activity, and yard work  PERSONAL FACTORS Past/current experiences and Time since onset of injury/illness/exacerbation are also affecting patient's functional outcome.   REHAB POTENTIAL: Good  CLINICAL DECISION MAKING: Stable/uncomplicated  EVALUATION COMPLEXITY: Low   GOALS: SHORT TERM GOALS: Target date: 12/11/2021  Patient will be independent with initial HEP and self-management strategies to improve functional outcomes Baseline:  Goal status: MET    LONG TERM GOALS: Target date: 12/25/2021  Patient will be independent with advanced HEP and self-management strategies to improve functional outcomes Baseline:  Goal status: MET  2.  Patient will improve FOTO score to predicted value to indicate improvement in functional outcomes Baseline: 50% (predictive value 59%) Goal status: MET  3.  Patient will be able to perform  stand x 5 in < 10 seconds to demonstrate improvement in functional mobility and reduced risk for falls.   Baseline: 11.67 sec with no UEs Goal status: MET  4. Patient will have equal to or > 4+/5 MMT throughout BLE to improve ability to perform functional mobility, stair ambulation and ADLs.  Baseline: See MMT Goal status: MET  PLAN: PT FREQUENCY: 1-2x/week  PT DURATION: 4 weeks  PLANNED INTERVENTIONS: Therapeutic exercises, Therapeutic activity, Neuromuscular re-education, Balance training, Gait training, Patient/Family education,  Joint manipulation, Joint mobilization, Stair training, Aquatic Therapy, Dry Needling, Electrical stimulation, Spinal manipulation, Spinal mobilization, Cryotherapy, Moist heat, scar mobilization, Taping, Traction, Ultrasound, Biofeedback, Ionotophoresis 35m/ml Dexamethasone, and Manual therapy.   PLAN FOR NEXT SESSION: Discharge as goals met.  8:54 AM, 12/23/21 ATeena Irani PTA/CLT CTwin BrooksPh: [phone] 10:35 AM, 12/23/21 Brandolyn Shortridge Small Lynch MPT [facility] [place] #[phone]

## 2021-12-26 ENCOUNTER — Encounter (HOSPITAL_COMMUNITY): Payer: Medicare Other | Admitting: Physical Therapy

## 2021-12-31 ENCOUNTER — Encounter (HOSPITAL_COMMUNITY): Payer: Medicare Other | Admitting: Physical Therapy

## 2021-12-31 ENCOUNTER — Ambulatory Visit: Payer: Medicare Other | Admitting: Urology

## 2022-01-02 ENCOUNTER — Encounter (HOSPITAL_COMMUNITY): Payer: Medicare Other | Admitting: Physical Therapy

## 2022-01-06 NOTE — Progress Notes (Addendum)
History of Present Illness:   Here for followup of BPH as well as ED.    There is a family history of prostate cancer, with his father dying from prostate cancer at the age of 70.  Last PSA recorded in our office was from July 2019 and was 1.2.       8.9.2022: IPSS 4  QoL score 2. He is on finasteride and tamsulosin. Pt says that PSA has been stable.  8.15.2023: IPSS 11 QoL score 2. Has upcoming PCP appt--will get PSA there.  He does have adequate success with sildenafil, 100 mg.  He is on both finasteride and tamsulosin.  Past Medical History:  Diagnosis Date   Arthritis    BPH (benign prostatic hyperplasia)    Complication of anesthesia    Slow to awaken.  [name] very few mediations )   De Quervain's syndrome (tenosynovitis) 12/29/13   Left   Depression    situational    Past Surgical History:  Procedure Laterality Date   CERVICAL FUSION  2010   CERVICAL LAMINECTOMY  1990   COLONOSCOPY W/ POLYPECTOMY     LUMBAR LAMINECTOMY/DECOMPRESSION MICRODISCECTOMY N/A 03/08/2015   Procedure: Lumbar vertebral three to four, lumbar vertebral four to five lumbar decompression;  Surgeon: Aliene Beams, MD;  Location: [facility];  Service: Neurosurgery;  Laterality: N/A;   Male Fertility     NASAL SINUS SURGERY      Home Medications:  Allergies as of 01/07/2022   No Known Allergies      Medication List        Accurate as of January 06, 2022 11:16 AM. If you have any questions, ask your nurse or doctor.          cetirizine 10 MG tablet Commonly known as: ZyrTEC Allergy Take 1 tablet (10 mg total) by mouth daily.   Dutasteride-Tamsulosin HCl 0.5-0.4 MG Caps Take 1 capsule by mouth daily.   finasteride 5 MG tablet Commonly known as: PROSCAR Take 1 tablet (5 mg total) by mouth daily.   HYDROcodone-acetaminophen 5-325 MG tablet Commonly known as: NORCO/VICODIN Take 1 tablet by mouth every 6 (six) hours as needed for moderate pain.   ibuprofen 600 MG tablet Commonly  known as: ADVIL Take 600 mg by mouth every 8 (eight) hours as needed for moderate pain (600- 800).   multivitamin with minerals Tabs tablet Take 1 tablet by mouth daily.   oxymetazoline 0.05 % nasal spray Commonly known as: AFRIN Place 1 spray into both nostrils at bedtime.   predniSONE 10 MG (21) Tbpk tablet Commonly known as: STERAPRED UNI-PAK 21 TAB Take 6 tabs by mouth daily  for 1 days, then 5 tabs for 1 days, then 4 tabs for 1 days, then 3 tabs for 1 days, 2 tabs for 1 days, then 1 tab by mouth daily for 1 days   sildenafil 100 MG tablet Commonly known as: VIAGRA TAKE 1 TABLET BY MOUTH DAILY AS NEEDED FOR ERECTILE DYSFUNCTION.   tamsulosin 0.4 MG Caps capsule Commonly known as: FLOMAX Take 1 capsule (0.4 mg total) by mouth daily.   Vitamin D (Ergocalciferol) 1.25 MG (50000 UNIT) Caps capsule Commonly known as: DRISDOL Take 50,000 Units by mouth once a week.        Allergies: No Known Allergies No family history on file.  ocial History:  reports that he has never smoked. He has never used smokeless tobacco. He reports that he does not drink alcohol and does not use drugs.  --  ROS: A complete review of systems was performed.  All systems are negative except for pertinent findings as noted.  Physical Exam:  Vital signs in last 24 hours: There were no vitals taken for this visit. Constitutional:  Alert and oriented, No acute distress Cardiovascular: Regular rate  Respiratory: Normal respiratory effort Genitourinary: Normal anal sphincter tone.  Prostate 40 g, symmetric, nonnodular, nontender. Lymphatic: No lymphadenopathy Neurologic: Grossly intact, no focal deficits Psychiatric: Normal mood and affect  I have reviewed prior pt notes  I have reviewed urinalysis results  I have i reviewed IPSS score  I have reviewed prior PSA results    Impression/Assessment:  1.  BPH, on dual medical therapy, symptoms stable.  DRE stable.  PSA pending  2.  ED, on  sildenafil  Plan:  1.  He will continue both finasteride and tamsulosin.  Sildenafil refilled  2.  I will have him come back in a year for recheck  3.  We will get old PSA results from his PCP  Add: PSA results-  9.26.2022--1.0

## 2022-01-07 ENCOUNTER — Encounter: Payer: Self-pay | Admitting: Urology

## 2022-01-07 ENCOUNTER — Ambulatory Visit: Payer: Medicare Other | Admitting: Urology

## 2022-01-07 VITALS — BP 114/73 | HR 66 | Ht 73.0 in | Wt 253.0 lb

## 2022-01-07 DIAGNOSIS — R3915 Urgency of urination: Secondary | ICD-10-CM | POA: Diagnosis not present

## 2022-01-07 DIAGNOSIS — N529 Male erectile dysfunction, unspecified: Secondary | ICD-10-CM | POA: Diagnosis not present

## 2022-01-07 DIAGNOSIS — N521 Erectile dysfunction due to diseases classified elsewhere: Secondary | ICD-10-CM

## 2022-01-07 DIAGNOSIS — N400 Enlarged prostate without lower urinary tract symptoms: Secondary | ICD-10-CM

## 2022-01-07 DIAGNOSIS — N401 Enlarged prostate with lower urinary tract symptoms: Secondary | ICD-10-CM

## 2022-01-07 MED ORDER — SILDENAFIL CITRATE 100 MG PO TABS: ORAL_TABLET | ORAL | 99 refills | Status: DC

## 2022-01-09 LAB — URINALYSIS, ROUTINE W REFLEX MICROSCOPIC
Bilirubin, UA: NEGATIVE
Glucose, UA: NEGATIVE
Ketones, UA: NEGATIVE
Leukocytes,UA: NEGATIVE
Nitrite, UA: NEGATIVE
Protein,UA: NEGATIVE
RBC, UA: NEGATIVE
Specific Gravity, UA: 1.015 (ref 1.005–1.030)
Urobilinogen, Ur: 0.2 mg/dL (ref 0.2–1.0)
pH, UA: 7.5 (ref 5.0–7.5)

## 2022-03-14 ENCOUNTER — Other Ambulatory Visit: Payer: Self-pay | Admitting: Neurosurgery

## 2022-03-27 NOTE — Progress Notes (Signed)
Surgical Instructions    Your procedure is scheduled on Friday, 04/04/22.  Report to [facility] Main Entrance "A" at 10:45 A.M., then check in with the Admitting office.  Call this number if you have problems the morning of surgery:  [phone]   If you have any questions prior to your surgery date call [phone]: Open Monday-Friday 8am-4pm If you experience any cold or flu symptoms such as cough, fever, chills, shortness of breath, etc. between now and your scheduled surgery, please notify us at the above number     Remember:  Do not eat or drink after midnight the night before your surgery     Take these medicines the morning of surgery with A SIP OF WATER:  finasteride (PROSCAR)  tamsulosin (FLOMAX)   As of today, STOP taking any Aspirin (unless otherwise instructed by your surgeon) Aleve, Naproxen, Ibuprofen, Motrin, Advil, Goody's, BC's, all herbal medications, fish oil, and all vitamins.           Do not wear jewelry or makeup. Do not wear lotions, powders, cologne or deodorant. Men may shave face and neck. Do not bring valuables to the hospital. Do not wear nail polish, gel polish, artificial nails, or any other type of covering on natural nails (fingers and toes) If you have artificial nails or gel coating that need to be removed by a nail salon, please have this removed prior to surgery. Artificial nails or gel coating may interfere with anesthesia's ability to adequately monitor your vital signs.  [facility] is not responsible for any belongings or valuables.    Do NOT Smoke (Tobacco/Vaping)  24 hours prior to your procedure  If you use a CPAP at night, you may bring your mask for your overnight stay.   Contacts, glasses, hearing aids, dentures or partials may not be worn into surgery, please bring cases for these belongings   For patients admitted to the hospital, discharge time will be determined by your treatment team.   Patients discharged the day of  surgery will not be allowed to drive home, and someone needs to stay with them for 24 hours.   SURGICAL WAITING ROOM VISITATION Patients having surgery or a procedure may have no more than 2 support people in the waiting area - these visitors may rotate.   Children under the age of 51 must have an adult with them who is not the patient. If the patient needs to stay at the hospital during part of their recovery, the visitor guidelines for inpatient rooms apply. Pre-op nurse will coordinate an appropriate time for 1 support person to accompany patient in pre-op.  This support person may not rotate.   Please refer to [email or web address] for the visitor guidelines for Inpatients (after your surgery is over and you are in a regular room).    Special instructions:    Oral Hygiene is also important to reduce your risk of infection.  Remember - BRUSH YOUR TEETH THE MORNING OF SURGERY WITH YOUR REGULAR TOOTHPASTE   [place]- Preparing For Surgery  Before surgery, you can play an important role. Because skin is not sterile, your skin needs to be as free of germs as possible. You can reduce the number of germs on your skin by washing with CHG (chlorahexidine gluconate) Soap before surgery.  CHG is an antiseptic cleaner which kills germs and bonds with the skin to continue killing germs even after washing.     Please do not use if you have an allergy to  CHG or antibacterial soaps. If your skin becomes reddened/irritated stop using the CHG.  Do not shave (including legs and underarms) for at least 48 hours prior to first CHG shower. It is OK to shave your face.  Please follow these instructions carefully.     Shower the NIGHT BEFORE SURGERY and the MORNING OF SURGERY with CHG Soap.   If you chose to wash your hair, wash your hair first as usual with your normal shampoo. After you shampoo, rinse your hair and body thoroughly to remove the shampoo.   Then [employer] and genitals (private parts) with your normal soap and rinse thoroughly to remove soap.  After that Use CHG Soap as you would any other liquid soap. You can apply CHG directly to the skin and wash gently with a scrungie or a clean washcloth.   Apply the CHG Soap to your body ONLY FROM THE NECK DOWN.  Do not use on open wounds or open sores. Avoid contact with your eyes, ears, mouth and genitals (private parts). Wash Face and genitals (private parts)  with your normal soap.   Wash thoroughly, paying special attention to the area where your surgery will be performed.  Thoroughly rinse your body with warm water from the neck down.  DO NOT shower/wash with your normal soap after using and rinsing off the CHG Soap.  Pat yourself dry with a CLEAN TOWEL.  Wear CLEAN PAJAMAS to bed the night before surgery  Place CLEAN SHEETS on your bed the night before your surgery  DO NOT SLEEP WITH PETS.   Day of Surgery: Take a shower with CHG soap. Wear Clean/Comfortable clothing the morning of surgery Do not apply any deodorants/lotions.   Remember to brush your teeth WITH YOUR REGULAR TOOTHPASTE.    If you received a COVID test during your pre-op visit, it is requested that you wear a mask when out in public, stay away from anyone that may not be feeling well, and notify your surgeon if you develop symptoms. If you have been in contact with anyone that has tested positive in the last 10 days, please notify your surgeon.    Please read over the following fact sheets that you were given.

## 2022-03-28 ENCOUNTER — Other Ambulatory Visit: Payer: Self-pay

## 2022-03-28 ENCOUNTER — Encounter (HOSPITAL_COMMUNITY): Payer: Self-pay

## 2022-03-28 ENCOUNTER — Encounter (HOSPITAL_COMMUNITY)
Admission: RE | Admit: 2022-03-28 | Discharge: 2022-03-28 | Disposition: A | Discharge status: Home / Self Care | Payer: Medicare Other | Source: Ambulatory Visit | Attending: Neurosurgery | Admitting: Neurosurgery

## 2022-03-28 VITALS — BP 118/79 | HR 59 | Temp 98.2°F | Resp 18 | Ht 73.0 in | Wt 264.5 lb

## 2022-03-28 DIAGNOSIS — Z01818 Encounter for other preprocedural examination: Secondary | ICD-10-CM | POA: Insufficient documentation

## 2022-03-28 LAB — CBC
HCT: 41 % (ref 39.0–52.0)
Hemoglobin: 13.4 g/dL (ref 13.0–17.0)
MCH: 30.2 pg (ref 26.0–34.0)
MCHC: 32.7 g/dL (ref 30.0–36.0)
MCV: 92.6 fL (ref 80.0–100.0)
Platelets: 180 10*3/uL (ref 150–400)
RBC: 4.43 MIL/uL (ref 4.22–5.81)
RDW: 12.5 % (ref 11.5–15.5)
WBC: 4.5 10*3/uL (ref 4.0–10.5)
nRBC: 0 % (ref 0.0–0.2)

## 2022-03-28 LAB — TYPE AND SCREEN
ABO/RH(D): O POS
Antibody Screen: NEGATIVE

## 2022-03-28 LAB — SURGICAL PCR SCREEN
MRSA, PCR: NEGATIVE
Staphylococcus aureus: NEGATIVE

## 2022-03-28 NOTE — Progress Notes (Signed)
PCP - [facility] Cardiologist - denies  PPM/ICD - denies   Chest x-ray - n/a EKG - n/a Stress Test - denies ECHO - denies Cardiac Cath - denies  Sleep Study - denies  Follow your surgeon's instructions on when to stop Aspirin.  If no instructions were given by your surgeon then you will need to call the office to get those instructions.     ERAS Protcol -no   COVID TEST- not needed   Anesthesia review: no  Patient denies shortness of breath, fever, cough and chest pain at PAT appointment   All instructions explained to the patient, with a verbal understanding of the material. Patient agrees to go over the instructions while at home for a better understanding. Patient also instructed to self quarantine after being tested for COVID-19. The opportunity to ask questions was provided.

## 2022-04-04 ENCOUNTER — Encounter (HOSPITAL_COMMUNITY)
Admission: RE | Disposition: A | Discharge status: Home / Self Care | Payer: Self-pay | Source: Home / Self Care | Attending: Neurosurgery

## 2022-04-04 ENCOUNTER — Ambulatory Visit (HOSPITAL_COMMUNITY): Payer: Medicare Other | Admitting: Certified Registered Nurse Anesthetist

## 2022-04-04 ENCOUNTER — Observation Stay (HOSPITAL_COMMUNITY)
Admission: RE | Admit: 2022-04-04 | Discharge: 2022-04-05 | Disposition: A | Discharge status: Home / Self Care | Payer: Medicare Other | Attending: Neurosurgery | Admitting: Neurosurgery

## 2022-04-04 ENCOUNTER — Observation Stay (HOSPITAL_COMMUNITY): Payer: Medicare Other

## 2022-04-04 ENCOUNTER — Ambulatory Visit (HOSPITAL_BASED_OUTPATIENT_CLINIC_OR_DEPARTMENT_OTHER): Payer: Medicare Other | Admitting: Certified Registered Nurse Anesthetist

## 2022-04-04 ENCOUNTER — Ambulatory Visit (HOSPITAL_COMMUNITY): Payer: Medicare Other

## 2022-04-04 ENCOUNTER — Encounter (HOSPITAL_COMMUNITY): Payer: Self-pay | Admitting: Neurosurgery

## 2022-04-04 DIAGNOSIS — M4316 Spondylolisthesis, lumbar region: Secondary | ICD-10-CM

## 2022-04-04 DIAGNOSIS — M48061 Spinal stenosis, lumbar region without neurogenic claudication: Secondary | ICD-10-CM | POA: Insufficient documentation

## 2022-04-04 DIAGNOSIS — M5416 Radiculopathy, lumbar region: Secondary | ICD-10-CM | POA: Diagnosis not present

## 2022-04-04 DIAGNOSIS — M7138 Other bursal cyst, other site: Secondary | ICD-10-CM | POA: Insufficient documentation

## 2022-04-04 DIAGNOSIS — R7309 Other abnormal glucose: Secondary | ICD-10-CM | POA: Diagnosis not present

## 2022-04-04 DIAGNOSIS — M4310 Spondylolisthesis, site unspecified: Secondary | ICD-10-CM | POA: Diagnosis present

## 2022-04-04 LAB — ABO/RH: ABO/RH(D): O POS

## 2022-04-04 SURGERY — POSTERIOR LUMBAR FUSION 1 LEVEL
Anesthesia: General | Site: Back

## 2022-04-04 MED ORDER — METHOCARBAMOL 500 MG PO TABS
500.0000 mg | ORAL_TABLET | Freq: Four times a day (QID) | ORAL | Status: DC | PRN
  Administered 2022-04-05: 500 mg via ORAL
  Filled 2022-04-04: qty 1

## 2022-04-04 MED ORDER — MIDAZOLAM HCL 5 MG/5ML IJ SOLN
INTRAMUSCULAR | Status: DC | PRN
  Administered 2022-04-04: 2 mg via INTRAVENOUS

## 2022-04-04 MED ORDER — LACTATED RINGERS IV SOLN: INTRAVENOUS | Status: DC

## 2022-04-04 MED ORDER — TAMSULOSIN HCL 0.4 MG PO CAPS
0.4000 mg | ORAL_CAPSULE | Freq: Every day | ORAL | Status: DC
  Administered 2022-04-05: 0.4 mg via ORAL
  Filled 2022-04-04 (×2): qty 1

## 2022-04-04 MED ORDER — CHLORHEXIDINE GLUCONATE CLOTH 2 % EX PADS: 6.0000 | MEDICATED_PAD | Freq: Once | CUTANEOUS | Status: DC

## 2022-04-04 MED ORDER — HYDROMORPHONE HCL 1 MG/ML IJ SOLN
INTRAMUSCULAR | Status: AC
  Filled 2022-04-04: qty 0.5

## 2022-04-04 MED ORDER — FENTANYL CITRATE (PF) 250 MCG/5ML IJ SOLN
INTRAMUSCULAR | Status: AC
  Filled 2022-04-04: qty 5

## 2022-04-04 MED ORDER — CHLORHEXIDINE GLUCONATE 0.12 % MT SOLN
15.0000 mL | Freq: Once | OROMUCOSAL | Status: AC
  Administered 2022-04-04: 15 mL via OROMUCOSAL
  Filled 2022-04-04: qty 15

## 2022-04-04 MED ORDER — ACETAMINOPHEN 500 MG PO TABS
1000.0000 mg | ORAL_TABLET | Freq: Once | ORAL | Status: AC
  Administered 2022-04-04: 1000 mg via ORAL
  Filled 2022-04-04: qty 2

## 2022-04-04 MED ORDER — THROMBIN 20000 UNITS EX SOLR
CUTANEOUS | Status: DC | PRN
  Administered 2022-04-04: 20 mL via TOPICAL

## 2022-04-04 MED ORDER — MIDAZOLAM HCL 2 MG/2ML IJ SOLN
INTRAMUSCULAR | Status: AC
  Filled 2022-04-04: qty 2

## 2022-04-04 MED ORDER — PHENYLEPHRINE 80 MCG/ML (10ML) SYRINGE FOR IV PUSH (FOR BLOOD PRESSURE SUPPORT)
PREFILLED_SYRINGE | INTRAVENOUS | Status: AC
  Filled 2022-04-04: qty 10

## 2022-04-04 MED ORDER — ROCURONIUM BROMIDE 10 MG/ML (PF) SYRINGE
PREFILLED_SYRINGE | INTRAVENOUS | Status: DC | PRN
  Administered 2022-04-04 (×2): 20 mg via INTRAVENOUS
  Administered 2022-04-04: 70 mg via INTRAVENOUS

## 2022-04-04 MED ORDER — DEXAMETHASONE SODIUM PHOSPHATE 10 MG/ML IJ SOLN
INTRAMUSCULAR | Status: AC
  Filled 2022-04-04: qty 1

## 2022-04-04 MED ORDER — SODIUM CHLORIDE 0.9% FLUSH: 3.0000 mL | Freq: Two times a day (BID) | INTRAVENOUS | Status: DC

## 2022-04-04 MED ORDER — ACETAMINOPHEN 325 MG PO TABS: 650.0000 mg | ORAL_TABLET | ORAL | Status: DC | PRN

## 2022-04-04 MED ORDER — SODIUM CHLORIDE 0.9 % IV SOLN
250.0000 mL | INTRAVENOUS | Status: DC
  Administered 2022-04-04: 250 mL via INTRAVENOUS

## 2022-04-04 MED ORDER — OSTEO BI-FLEX TRIPLE STRENGTH PO TABS: ORAL_TABLET | Freq: Every morning | ORAL | Status: DC

## 2022-04-04 MED ORDER — CEFAZOLIN SODIUM-DEXTROSE 2-4 GM/100ML-% IV SOLN
2.0000 g | INTRAVENOUS | Status: AC
  Administered 2022-04-04: 2 g via INTRAVENOUS
  Filled 2022-04-04: qty 100

## 2022-04-04 MED ORDER — OXYCODONE HCL 5 MG PO TABS: 5.0000 mg | ORAL_TABLET | Freq: Once | ORAL | Status: DC | PRN

## 2022-04-04 MED ORDER — OXYCODONE HCL 5 MG PO TABS: 10.0000 mg | ORAL_TABLET | ORAL | Status: DC | PRN

## 2022-04-04 MED ORDER — SODIUM CHLORIDE 0.9% FLUSH: 3.0000 mL | INTRAVENOUS | Status: DC | PRN

## 2022-04-04 MED ORDER — ONDANSETRON HCL 4 MG PO TABS: 4.0000 mg | ORAL_TABLET | Freq: Four times a day (QID) | ORAL | Status: DC | PRN

## 2022-04-04 MED ORDER — HYDROCODONE-ACETAMINOPHEN 10-325 MG PO TABS: 1.0000 | ORAL_TABLET | ORAL | Status: DC | PRN

## 2022-04-04 MED ORDER — HYDROMORPHONE HCL 1 MG/ML IJ SOLN
0.2500 mg | INTRAMUSCULAR | Status: DC | PRN
  Administered 2022-04-04: 0.5 mg via INTRAVENOUS

## 2022-04-04 MED ORDER — VANCOMYCIN HCL 1000 MG IV SOLR
INTRAVENOUS | Status: AC
  Filled 2022-04-04: qty 20

## 2022-04-04 MED ORDER — ONDANSETRON HCL 4 MG/2ML IJ SOLN: 4.0000 mg | Freq: Four times a day (QID) | INTRAMUSCULAR | Status: DC | PRN

## 2022-04-04 MED ORDER — MIDAZOLAM HCL 2 MG/2ML IJ SOLN: 0.5000 mg | Freq: Once | INTRAMUSCULAR | Status: DC | PRN

## 2022-04-04 MED ORDER — PROPOFOL 10 MG/ML IV BOLUS
INTRAVENOUS | Status: AC
  Filled 2022-04-04: qty 20

## 2022-04-04 MED ORDER — THROMBIN 20000 UNITS EX SOLR
CUTANEOUS | Status: AC
  Filled 2022-04-04: qty 20000

## 2022-04-04 MED ORDER — LIDOCAINE 2% (20 MG/ML) 5 ML SYRINGE
INTRAMUSCULAR | Status: DC | PRN
  Administered 2022-04-04: 20 mg via INTRAVENOUS

## 2022-04-04 MED ORDER — ORAL CARE MOUTH RINSE: 15.0000 mL | Freq: Once | OROMUCOSAL | Status: AC

## 2022-04-04 MED ORDER — PROPOFOL 10 MG/ML IV BOLUS
INTRAVENOUS | Status: DC | PRN
  Administered 2022-04-04: 200 mg via INTRAVENOUS

## 2022-04-04 MED ORDER — ACETAMINOPHEN 650 MG RE SUPP: 650.0000 mg | RECTAL | Status: DC | PRN

## 2022-04-04 MED ORDER — ADULT MULTIVITAMIN W/MINERALS CH
1.0000 | ORAL_TABLET | Freq: Every day | ORAL | Status: DC
  Administered 2022-04-05: 1 via ORAL
  Filled 2022-04-04: qty 1

## 2022-04-04 MED ORDER — FLEET ENEMA 7-19 GM/118ML RE ENEM: 1.0000 | ENEMA | Freq: Once | RECTAL | Status: DC | PRN

## 2022-04-04 MED ORDER — HYDROMORPHONE HCL 1 MG/ML IJ SOLN: 1.0000 mg | INTRAMUSCULAR | Status: DC | PRN

## 2022-04-04 MED ORDER — FENTANYL CITRATE (PF) 250 MCG/5ML IJ SOLN
INTRAMUSCULAR | Status: DC | PRN
  Administered 2022-04-04 (×3): 50 ug via INTRAVENOUS
  Administered 2022-04-04: 100 ug via INTRAVENOUS

## 2022-04-04 MED ORDER — CEFAZOLIN SODIUM-DEXTROSE 1-4 GM/50ML-% IV SOLN
1.0000 g | Freq: Three times a day (TID) | INTRAVENOUS | Status: AC
  Administered 2022-04-04 – 2022-04-05 (×2): 1 g via INTRAVENOUS
  Filled 2022-04-04 (×2): qty 50

## 2022-04-04 MED ORDER — PROMETHAZINE HCL 25 MG/ML IJ SOLN: 6.2500 mg | INTRAMUSCULAR | Status: DC | PRN

## 2022-04-04 MED ORDER — HYDROMORPHONE HCL 1 MG/ML IJ SOLN
INTRAMUSCULAR | Status: DC | PRN
  Administered 2022-04-04 (×2): .5 mg via INTRAVENOUS

## 2022-04-04 MED ORDER — BUPIVACAINE HCL (PF) 0.25 % IJ SOLN
INTRAMUSCULAR | Status: DC | PRN
  Administered 2022-04-04: 20 mL

## 2022-04-04 MED ORDER — PHENOL 1.4 % MT LIQD: 1.0000 | OROMUCOSAL | Status: DC | PRN

## 2022-04-04 MED ORDER — FINASTERIDE 5 MG PO TABS
5.0000 mg | ORAL_TABLET | Freq: Every day | ORAL | Status: DC
  Administered 2022-04-05: 5 mg via ORAL
  Filled 2022-04-04 (×2): qty 1

## 2022-04-04 MED ORDER — MENTHOL 3 MG MT LOZG: 1.0000 | LOZENGE | OROMUCOSAL | Status: DC | PRN

## 2022-04-04 MED ORDER — SUGAMMADEX SODIUM 200 MG/2ML IV SOLN
INTRAVENOUS | Status: DC | PRN
  Administered 2022-04-04: 400 mg via INTRAVENOUS

## 2022-04-04 MED ORDER — BISACODYL 10 MG RE SUPP: 10.0000 mg | Freq: Every day | RECTAL | Status: DC | PRN

## 2022-04-04 MED ORDER — ONDANSETRON HCL 4 MG/2ML IJ SOLN
INTRAMUSCULAR | Status: DC | PRN
  Administered 2022-04-04: 4 mg via INTRAVENOUS

## 2022-04-04 MED ORDER — DEXAMETHASONE SODIUM PHOSPHATE 10 MG/ML IJ SOLN
INTRAMUSCULAR | Status: DC | PRN
  Administered 2022-04-04: 10 mg via INTRAVENOUS

## 2022-04-04 MED ORDER — MEPERIDINE HCL 25 MG/ML IJ SOLN: 6.2500 mg | INTRAMUSCULAR | Status: DC | PRN

## 2022-04-04 MED ORDER — EPHEDRINE 5 MG/ML INJ
INTRAVENOUS | Status: AC
  Filled 2022-04-04: qty 5

## 2022-04-04 MED ORDER — DIAZEPAM 5 MG PO TABS: 5.0000 mg | ORAL_TABLET | Freq: Four times a day (QID) | ORAL | Status: DC | PRN

## 2022-04-04 MED ORDER — ROCURONIUM BROMIDE 10 MG/ML (PF) SYRINGE
PREFILLED_SYRINGE | INTRAVENOUS | Status: AC
  Filled 2022-04-04: qty 20

## 2022-04-04 MED ORDER — HYDROMORPHONE HCL 1 MG/ML IJ SOLN
INTRAMUSCULAR | Status: AC
  Filled 2022-04-04: qty 1

## 2022-04-04 MED ORDER — BUPIVACAINE HCL (PF) 0.25 % IJ SOLN
INTRAMUSCULAR | Status: AC
  Filled 2022-04-04: qty 30

## 2022-04-04 MED ORDER — ONDANSETRON HCL 4 MG/2ML IJ SOLN
INTRAMUSCULAR | Status: AC
  Filled 2022-04-04: qty 4

## 2022-04-04 MED ORDER — EPHEDRINE SULFATE-NACL 50-0.9 MG/10ML-% IV SOSY
PREFILLED_SYRINGE | INTRAVENOUS | Status: DC | PRN
  Administered 2022-04-04 (×3): 5 mg via INTRAVENOUS

## 2022-04-04 MED ORDER — OXYCODONE HCL 5 MG/5ML PO SOLN: 5.0000 mg | Freq: Once | ORAL | Status: DC | PRN

## 2022-04-04 MED ORDER — VANCOMYCIN HCL 1000 MG IV SOLR
INTRAVENOUS | Status: DC | PRN
  Administered 2022-04-04: 1000 mg via TOPICAL

## 2022-04-04 MED ORDER — VITAMIN D 25 MCG (1000 UNIT) PO TABS
5000.0000 [IU] | ORAL_TABLET | Freq: Every day | ORAL | Status: DC
  Administered 2022-04-05: 5000 [IU] via ORAL
  Filled 2022-04-04: qty 5

## 2022-04-04 MED ORDER — LIDOCAINE 2% (20 MG/ML) 5 ML SYRINGE
INTRAMUSCULAR | Status: AC
  Filled 2022-04-04: qty 5

## 2022-04-04 MED ORDER — 0.9 % SODIUM CHLORIDE (POUR BTL) OPTIME
TOPICAL | Status: DC | PRN
  Administered 2022-04-04: 1000 mL

## 2022-04-04 MED ORDER — POLYETHYLENE GLYCOL 3350 17 G PO PACK: 17.0000 g | PACK | Freq: Every day | ORAL | Status: DC | PRN

## 2022-04-04 MED ORDER — HYDROCODONE-ACETAMINOPHEN 5-325 MG PO TABS
1.0000 | ORAL_TABLET | ORAL | Status: DC | PRN
  Administered 2022-04-04 – 2022-04-05 (×3): 1 via ORAL
  Filled 2022-04-04 (×3): qty 1

## 2022-04-04 SURGICAL SUPPLY — 62 items
BAG COUNTER SPONGE SURGICOUNT (BAG) ×1 IMPLANT
BAG DECANTER FOR FLEXI CONT (MISCELLANEOUS) ×1 IMPLANT
BENZOIN TINCTURE PRP APPL 2/3 (GAUZE/BANDAGES/DRESSINGS) ×1 IMPLANT
BLADE BONE MILL MEDIUM (MISCELLANEOUS) ×1 IMPLANT
BLADE CLIPPER SURG (BLADE) IMPLANT
BUR CUTTER 7.0 ROUND (BURR) IMPLANT
BUR MATCHSTICK NEURO 3.0 LAGG (BURR) ×1 IMPLANT
CANISTER SUCT 3000ML PPV (MISCELLANEOUS) ×1 IMPLANT
CAP LCK SPNE (Orthopedic Implant) ×4 IMPLANT
CAP LOCK SPINE RADIUS (Orthopedic Implant) IMPLANT
CAP LOCKING (Orthopedic Implant) ×4 IMPLANT
CNTNR URN SCR LID CUP LEK RST (MISCELLANEOUS) ×1 IMPLANT
CONT SPEC 4OZ STRL OR WHT (MISCELLANEOUS) ×1
COVER BACK TABLE 60X90IN (DRAPES) ×1 IMPLANT
DERMABOND ADVANCED .7 DNX12 (GAUZE/BANDAGES/DRESSINGS) ×1 IMPLANT
DRAPE C-ARM 42X72 X-RAY (DRAPES) ×2 IMPLANT
DRAPE HALF SHEET 40X57 (DRAPES) IMPLANT
DRAPE LAPAROTOMY 100X72X124 (DRAPES) ×1 IMPLANT
DRAPE SURG 17X23 STRL (DRAPES) ×4 IMPLANT
DRSG OPSITE POSTOP 4X6 (GAUZE/BANDAGES/DRESSINGS) ×1 IMPLANT
DURAPREP 26ML APPLICATOR (WOUND CARE) ×1 IMPLANT
ELECT REM PT RETURN 9FT ADLT (ELECTROSURGICAL) ×1
ELECTRODE REM PT RTRN 9FT ADLT (ELECTROSURGICAL) ×1 IMPLANT
EVACUATOR 1/8 PVC DRAIN (DRAIN) IMPLANT
GAUZE 4X4 16PLY ~~LOC~~+RFID DBL (SPONGE) IMPLANT
GAUZE SPONGE 4X4 12PLY STRL (GAUZE/BANDAGES/DRESSINGS) IMPLANT
GLOVE BIO SURGEON STRL SZ 6.5 (GLOVE) ×1 IMPLANT
GLOVE BIOGEL PI IND STRL 6.5 (GLOVE) ×1 IMPLANT
GLOVE BIOGEL PI IND STRL 7.0 (GLOVE) IMPLANT
GLOVE BIOGEL PI IND STRL 7.5 (GLOVE) IMPLANT
GLOVE ECLIPSE 7.0 STRL STRAW (GLOVE) IMPLANT
GLOVE ECLIPSE 9.0 STRL (GLOVE) ×2 IMPLANT
GLOVE EXAM NITRILE XL STR (GLOVE) IMPLANT
GOWN STRL REUS W/ TWL LRG LVL3 (GOWN DISPOSABLE) IMPLANT
GOWN STRL REUS W/ TWL XL LVL3 (GOWN DISPOSABLE) ×2 IMPLANT
GOWN STRL REUS W/TWL 2XL LVL3 (GOWN DISPOSABLE) IMPLANT
GOWN STRL REUS W/TWL LRG LVL3 (GOWN DISPOSABLE) ×2
GOWN STRL REUS W/TWL XL LVL3 (GOWN DISPOSABLE) ×4
KIT BASIN OR (CUSTOM PROCEDURE TRAY) ×1 IMPLANT
KIT TURNOVER KIT B (KITS) ×1 IMPLANT
MILL MEDIUM DISP (BLADE) ×1 IMPLANT
NEEDLE HYPO 22GX1.5 SAFETY (NEEDLE) ×1 IMPLANT
NS IRRIG 1000ML POUR BTL (IV SOLUTION) ×1 IMPLANT
PACK LAMINECTOMY NEURO (CUSTOM PROCEDURE TRAY) ×1 IMPLANT
PUTTY GRAFTON DBF 6CC W/DELIVE (Putty) IMPLANT
ROD RADIUS 45MM (Rod) ×2 IMPLANT
ROD SPNL 45X5.5XNS TI RDS (Rod) IMPLANT
SCREW 5.75X50MM (Screw) IMPLANT
SPACER PL CATALYFT LONG 11 (Spacer) IMPLANT
SPIKE FLUID TRANSFER (MISCELLANEOUS) ×1 IMPLANT
SPONGE SURGIFOAM ABS GEL 100 (HEMOSTASIS) ×1 IMPLANT
STRIP CLOSURE SKIN 1/2X4 (GAUZE/BANDAGES/DRESSINGS) ×2 IMPLANT
SUT PROLENE 5 0 C1 (SUTURE) IMPLANT
SUT PROLENE 6 0 BV (SUTURE) IMPLANT
SUT VIC AB 0 CT1 18XCR BRD8 (SUTURE) ×2 IMPLANT
SUT VIC AB 0 CT1 8-18 (SUTURE) ×2
SUT VIC AB 2-0 CT1 18 (SUTURE) ×1 IMPLANT
SUT VIC AB 3-0 SH 8-18 (SUTURE) ×2 IMPLANT
TOWEL GREEN STERILE (TOWEL DISPOSABLE) ×1 IMPLANT
TOWEL GREEN STERILE FF (TOWEL DISPOSABLE) ×1 IMPLANT
TRAY FOLEY MTR SLVR 16FR STAT (SET/KITS/TRAYS/PACK) ×1 IMPLANT
WATER STERILE IRR 1000ML POUR (IV SOLUTION) ×1 IMPLANT

## 2022-04-04 NOTE — Brief Op Note (Signed)
04/04/2022  4:29 PM  PATIENT:  Gunnar Fusi  66 y.o. male  PRE-OPERATIVE DIAGNOSIS:  Spondylolisthesis  POST-OPERATIVE DIAGNOSIS:  Spondylolisthesis  PROCEDURE:  Procedure(s): Posterior Lumbar Interbody Fusion - Lumbar three-Lumbar four (N/A)  SURGEON:  Surgeon(s) and Role:    * Julio Sicks, MD - Primary    * Dawley, Alan Mulder, DO - Assisting  PHYSICIAN ASSISTANT:   ASSISTANTSMarland Mcalpine   ANESTHESIA:   general  EBL:  350 mL   BLOOD ADMINISTERED:none  DRAINS: none   LOCAL MEDICATIONS USED:  MARCAINE     SPECIMEN:  No Specimen  DISPOSITION OF SPECIMEN:  N/A  COUNTS:  YES  TOURNIQUET:  * No tourniquets in log *  DICTATION: .Dragon Dictation  PLAN OF CARE: Admit for overnight observation  PATIENT DISPOSITION:  PACU - hemodynamically stable.   Delay start of Pharmacological VTE agent (>24hrs) due to surgical blood loss or risk of bleeding: yes

## 2022-04-04 NOTE — Anesthesia Preprocedure Evaluation (Addendum)
Anesthesia Evaluation  Patient identified by MRN, date of birth, ID band Patient awake    Reviewed: Allergy & Precautions, NPO status , Patient's Chart, lab work & pertinent test results  History of Anesthesia Complications Negative for: history of anesthetic complications  Airway Mallampati: III  TM Distance: >3 FB Neck ROM: Full  Mouth opening: Limited Mouth Opening  Dental  (+) Dental Advisory Given   Pulmonary neg pulmonary ROS   breath sounds clear to auscultation       Cardiovascular negative cardio ROS  Rhythm:Regular Rate:Normal     Neuro/Psych    Depression    Back pain    GI/Hepatic negative GI ROS, Neg liver ROS,,,  Endo/Other  BMI 33.5  Renal/GU negative Renal ROS     Musculoskeletal  (+) Arthritis ,    Abdominal  (+) + obese  Peds  Hematology negative hematology ROS (+)   Anesthesia Other Findings   Reproductive/Obstetrics                             Anesthesia Physical Anesthesia Plan  ASA: 2  Anesthesia Plan: General   Post-op Pain Management: Tylenol PO (pre-op)*   Induction: Intravenous  PONV Risk Score and Plan: 2 and Ondansetron and Dexamethasone  Airway Management Planned: Oral ETT and Video Laryngoscope Planned  Additional Equipment: None  Intra-op Plan:   Post-operative Plan: Extubation in OR  Informed Consent: I have reviewed the patients History and Physical, chart, labs and discussed the procedure including the risks, benefits and alternatives for the proposed anesthesia with the patient or authorized representative who has indicated his/her understanding and acceptance.     Dental advisory given  Plan Discussed with: CRNA and Surgeon  Anesthesia Plan Comments:         Anesthesia Quick Evaluation

## 2022-04-04 NOTE — Transfer of Care (Signed)
Immediate Anesthesia Transfer of Care Note  Patient: Geoffrey Andrews  Procedure(s) Performed: Posterior Lumbar Interbody Fusion - Lumbar three-Lumbar four (Back)  Patient Location: PACU  Anesthesia Type:General  Level of Consciousness: drowsy, patient cooperative, and responds to stimulation  Airway & Oxygen Therapy: Patient Spontanous Breathing  Post-op Assessment: Report given to RN and Post -op Vital signs reviewed and stable  Post vital signs: Reviewed and stable  Last Vitals:  Vitals Value Taken Time  BP 120/65 04/04/22 1637  Temp    Pulse 64 04/04/22 1639  Resp 11 04/04/22 1639  SpO2 96 % 04/04/22 1639  Vitals shown include unvalidated device data.  Last Pain:  Vitals:   04/04/22 1103  TempSrc:   PainSc: 0-No pain      Patients Stated Pain Goal: 0 (04/04/22 1103)  Complications:  Encounter Notable Events  Notable Event Outcome Phase Comment  Difficult to intubate - expected  Intraprocedure Filed from anesthesia note documentation.

## 2022-04-04 NOTE — H&P (Signed)
Geoffrey Andrews is an 66 y.o. male.   Chief Complaint: Back pain HPI: 66 year old male with past history of prior L3-4 and L4-5 decompressive surgery presents with worsening back and bilateral lower extremity pain numbness and weakness failing conservative manage.  Work-up demonstrates evidence of an unstable degenerative spondylolisthesis at L3-4 with severe stenosis.  The L4-5 level appears to be holding other well.  He presents now for decompression and fusion surgery at L3-4.  Past Medical History:  Diagnosis Date   Arthritis    BPH (benign prostatic hyperplasia)    Complication of anesthesia    Slow to awaken.  Geoffrey Andrews very few mediations )   De Quervain's syndrome (tenosynovitis) 12/29/13   Left   Depression    situational    Past Surgical History:  Procedure Laterality Date   CERVICAL FUSION  05/26/2008   CERVICAL LAMINECTOMY  05/26/1988   COLONOSCOPY W/ POLYPECTOMY     LUMBAR LAMINECTOMY/DECOMPRESSION MICRODISCECTOMY N/A 03/08/2015   Procedure: Lumbar vertebral three to four, lumbar vertebral four to five lumbar decompression;  Surgeon: Aliene Beams, MD;  Location: [facility];  Service: Neurosurgery;  Laterality: N/A;   Male Fertility     testicular biopsy at age 41   NASAL SINUS SURGERY      History reviewed. No pertinent family history. Social History:  reports that he has never smoked. He has never used smokeless tobacco. He reports that he does not drink alcohol and does not use drugs.  Allergies: No Known Allergies  Medications Prior to Admission  Medication Sig Dispense Refill   Cholecalciferol (VITAMIN D3) 125 MCG (5000 UT) TABS Take 5,000 Units by mouth in the morning.     finasteride (PROSCAR) 5 MG tablet Take 1 tablet (5 mg total) by mouth daily. 30 tablet 11   ibuprofen (ADVIL) 200 MG tablet Take 400 mg by mouth at bedtime as needed (pain.).     Misc Natural Products (OSTEO BI-FLEX TRIPLE STRENGTH PO) Take 1 tablet by mouth in the morning.     Multiple Vitamin  (MULTIVITAMIN WITH MINERALS) TABS tablet Take 1 tablet by mouth in the morning. Men's Multivitamin     sildenafil (VIAGRA) 100 MG tablet TAKE 1 TABLET BY MOUTH DAILY AS NEEDED FOR ERECTILE DYSFUNCTION. 10 tablet PRN   tamsulosin (FLOMAX) 0.4 MG CAPS capsule Take 1 capsule (0.4 mg total) by mouth daily. 30 capsule 11    Results for orders placed or performed during the hospital encounter of 04/04/22 (from the past 48 hour(s))  ABO/Rh     Status: None   Collection Time: 04/04/22 11:08 AM  Result Value Ref Range   ABO/RH(D)      O POS Performed at [facility], [address], [place], [place] [address]    No results found.  Pertinent items noted in HPI and remainder of comprehensive ROS otherwise negative.  Blood pressure 126/78, pulse 62, temperature 98.8 F (37.1 C), temperature source Oral, resp. rate 17, height 6\' 1"  (1.854 m), weight 115.2 kg, SpO2 96 %.  Patient is awake and alert.  He is oriented and appropriate.  Speech is fluent.  Judgment insight are intact.  Cranial nerve function normal bilateral.  Motor examination reveals weakness of his quadriceps muscles.  4 - or 5 bilateral.  Sensory examination decrease sensation pinprick light touch in his L4 dermatomes and L3 dermatomes bilaterally.  Deep intermix is hypoactive but symmetric.  Gait antalgic.  Posture flexed peer examination head ears eyes nose and throat is  unremarked.  Chest and abdomen are benign.  Extremities are free from injury or deformity. 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  Plan bilateral L3-4 redo decompressive laminectomy with L3-4 posterior lumbar to body fusion utilizing interbody cages, will curbside autograft, and coupled with posterior lateral arthrodesis utilizing nonsegmental pedicle screw fixation and local autograft.  Risks and benefits of been explained.  Patient wishes to proceed.  Geoffrey Andrews 04/04/2022,  11:59 AM

## 2022-04-04 NOTE — Progress Notes (Signed)
Orthopedic Tech Progress Note Patient Details:  Geoffrey Andrews February 02, 1956 [id number] Patient has brace  Patient ID: Geoffrey Andrews, male   DOB: [birth year], 66 y.o.   MRN: [id number]  Geoffrey Andrews 04/04/2022, 8:31 PM

## 2022-04-04 NOTE — Anesthesia Procedure Notes (Signed)
Procedure Name: Intubation Date/Time: 04/04/2022 1:42 PM  Performed by: Waynard Edwards, CRNAPre-anesthesia Checklist: Patient identified, Emergency Drugs available, Suction available and Patient being monitored Patient Re-evaluated:Patient Re-evaluated prior to induction Oxygen Delivery Method: Circle system utilized Preoxygenation: Pre-oxygenation with 100% oxygen Induction Type: IV induction Ventilation: Mask ventilation with difficulty, Oral airway inserted - appropriate to patient size and Two handed mask ventilation required Laryngoscope Size: Glidescope and 4 Grade View: Grade I Tube type: Oral Tube size: 7.5 mm Number of attempts: 1 Airway Equipment and Method: Rigid stylet and Video-laryngoscopy Placement Confirmation: ETT inserted through vocal cords under direct vision, positive ETCO2 and breath sounds checked- equal and bilateral Secured at: 24 cm Tube secured with: Tape Dental Injury: Teeth and Oropharynx as per pre-operative assessment  Difficulty Due To: Difficulty was anticipated and Difficult Airway- due to large tongue

## 2022-04-04 NOTE — Op Note (Signed)
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  Asst. Surgeon: Jabier Gauss; Doran Durand, NP  Anesthesia: General  Indication: 66 year old male status post prior L3-4 and L4-5 decompressive laminectomy.  Patient presents with severe back and bilateral lower extremity symptoms.  Work-up demonstrates evidence of dynamic instability at L3-4 with a degenerative/postlaminectomy spondylolisthesis at L3-4.  MRI scanning demonstrates severe stenosis secondary to facet arthropathy and a leftward synovial cyst.  Patient presents now for decompression and fusion surgery.  The L4-5 level looks good.  The rest of his spine is free from any significant stenosis.  Operative note: After induction anesthesia, patient positioned prone onto Wilson frame and appropriately padded.  Lumbar region prepped and draped sterilely.  Incision made overlying L3-4.  Dissection performed bilaterally.  Retractor placed.  Fluoroscopy used.  Levels confirmed.  Previous laminectomy at L3-4 was dissected free.  The laminectomy was extended cephalad.  Complete inferior facetectomy was then performed bilaterally removing the inferior facet and pars interarticularis of L3.  Superior facetectomies of L4 were performed.  Ligament flavum elevated and resected as was the epidural scar.  Microscope brought in field used microdissection.  An  adherent leftward L3-4 synovial cyst causing compression of the left L4 nerve root was gently dissected free.  At this point a very thorough decompression of been achieved.  Bilateral discectomies were then performed at L3-4.  Disc base was then prepared for interbody fusion.  With the disc space distracted on the patient's right side.  Disc base was further cleaned on the left side.  An 11 mm [employer] expandable cage was then impacted in the place and expanded on the left side.  Distractor removed patient's right side.  Disc base prepared on the right side.  Morselized autograft packed in their space.  A second cage was then impacted in the place and expanded.  Pedicles of L3 and L4 were identified using surface landmarks and intraoperative fluoroscopy.  Superficial bone overlying the pedicle was then removed using high-speed drill.  Each pedicle was then probed using a pedicle all each pedicle all track was probed and found to be solidly within the bone.  Each pedicle all track was then tapped.  Each screw tap hole was probed and found to be solidly within the bone.  5.75 x 50 mm radius brand screws from [employer] placed bilaterally at L3 and L4.  Final images reveal good position of the cages and the hardware at the proper operative level with normal alignment of spine.  Each cage was then packed with demineralized bone fibers.  Gelfoam was placed over the laminectomy defect.  Short segment titanium rod placed to the screw heads at L3 and L4.  Locking caps placed over the screws.  Locking caps then engaged.  Transverse processes were decorticated.  Morselized autograft was packed posterior laterally.  Vancomycin powder was placed in the deep wound space.  Wounds and closed in layers with Vicryl sutures.  Steri-Strips and sterile dressing were applied.  No apparent complications.  Patient tolerated the procedure well and he  returns to the recovery room postop.

## 2022-04-05 ENCOUNTER — Other Ambulatory Visit: Payer: Self-pay

## 2022-04-05 DIAGNOSIS — M4316 Spondylolisthesis, lumbar region: Secondary | ICD-10-CM | POA: Diagnosis not present

## 2022-04-05 LAB — GLUCOSE, CAPILLARY: Glucose-Capillary: 224 mg/dL — ABNORMAL HIGH (ref 70–99)

## 2022-04-05 MED ORDER — METHOCARBAMOL 500 MG PO TABS: 500.0000 mg | ORAL_TABLET | Freq: Four times a day (QID) | ORAL | 1 refills | Status: DC | PRN

## 2022-04-05 MED ORDER — HYDROCODONE-ACETAMINOPHEN 10-325 MG PO TABS: 1.0000 | ORAL_TABLET | Freq: Four times a day (QID) | ORAL | 0 refills | Status: DC | PRN

## 2022-04-05 NOTE — Evaluation (Signed)
Physical Therapy Evaluation Patient Details Name: Geoffrey Andrews MRN: [id number] DOB: 01-01-1956 Today's Date: 04/05/2022  History of Present Illness  66 y/o male s/p PLIF L3-4 on 04/04/22. PMHx: arthritis, BPH, depression  Clinical Impression  Patient admitted following above procedure. Patient will have wife to assist at discharge. Patient reporting numerous falls prior to surgery due to L LE weakness. Demonstrating L hip weakness with ambulation and stair negotiation. Requires RW for support due to LE weakness. Educated patient on back precautions, brace wear, and activity progression, patient and wife verbalized understanding. No further skilled PT needs identified acutely. No PT follow up recommended at this time, but he would benefit from OPPT once cleared by MD for LE strengthening and balance training.        Recommendations for follow up therapy are one component of a multi-disciplinary discharge planning process, led by the attending physician.  Recommendations may be updated based on patient status, additional functional criteria and insurance authorization.  Follow Up Recommendations Other (comment) (would benefit from OPPT once cleared by MD)      Assistance Recommended at Discharge Intermittent Supervision/Assistance  Patient can return home with the following  A little help with walking and/or transfers;A little help with bathing/dressing/bathroom;Help with stairs or ramp for entrance    Equipment Recommendations Rolling Jae Skeet (2 wheels)  Recommendations for Other Services       Functional Status Assessment Patient has had a recent decline in their functional status and demonstrates the ability to make significant improvements in function in a reasonable and predictable amount of time.     Precautions / Restrictions Precautions Precautions: Back;Fall Precaution Booklet Issued: Yes (comment) Required Braces or Orthoses: Spinal Brace Spinal Brace: Applied in sitting  position;Lumbar corset Restrictions Weight Bearing Restrictions: No      Mobility  Bed Mobility               General bed mobility comments: sitting EOB on arrival    Transfers Overall transfer level: Needs assistance Equipment used: Rolling Timithy Arons (2 wheels) Transfers: Sit to/from Stand Sit to Stand: Supervision           General transfer comment: cues for hand placement    Ambulation/Gait Ambulation/Gait assistance: Supervision Gait Distance (Feet): 150 Feet Assistive device: Rolling Kort Stettler (2 wheels) Gait Pattern/deviations: Step-through pattern, Decreased stride length, Trendelenburg Gait velocity: decreased     General Gait Details: supervision for safety. L hip weakness noted with trendelenburg gait  Stairs Stairs: Yes Stairs assistance: Min guard Stair Management: One rail Right, Step to pattern, Forwards Number of Stairs: 2 General stair comments: Ascended leading with R LE with L LE adducting behind R on ascent. Min guard for safey.  Wheelchair Mobility    Modified Rankin (Stroke Patients Only)       Balance Overall balance assessment: Needs assistance Sitting-balance support: Feet supported Sitting balance-Leahy Scale: Good     Standing balance support: No upper extremity supported, During functional activity Standing balance-Leahy Scale: Fair                               Pertinent Vitals/Pain Pain Assessment Pain Assessment: Faces Faces Pain Scale: Hurts a little bit Pain Location: back Pain Descriptors / Indicators: Grimacing, Guarding Pain Intervention(s): Monitored during session    Home Living Family/patient expects to be discharged to:: Private residence Living Arrangements: Spouse/significant other Available Help at Discharge: Family;Available PRN/intermittently Type of Home: House Home Access: Stairs to enter  Entrance Stairs-Rails: None Entrance Stairs-Number of Steps: 2   Home Layout: One level Home  Equipment: Standard Kadesha Virrueta;Cane - single point Additional Comments: daughter is an Charity fundraiser at [employer]    Prior Function Prior Level of Function : Independent/Modified Independent;History of Falls (last six months)             Mobility Comments: 3 falls in 3 months, intermittent use of SPC/Ayesha Markwell ADLs Comments: indep     Hand Dominance   Dominant Hand: Right    Extremity/Trunk Assessment   Upper Extremity Assessment Upper Extremity Assessment: Defer to OT evaluation    Lower Extremity Assessment Lower Extremity Assessment: Generalized weakness (L > R)    Cervical / Trunk Assessment Cervical / Trunk Assessment: Back Surgery  Communication   Communication: HOH  Cognition Arousal/Alertness: Awake/alert Behavior During Therapy: WFL for tasks assessed/performed Overall Cognitive Status: Within Functional Limits for tasks assessed                                          General Comments General comments (skin integrity, edema, etc.): VSS on RA, wife present    Exercises     Assessment/Plan    PT Assessment Patient does not need any further PT services  PT Problem List         PT Treatment Interventions      PT Goals (Current goals can be found in the Care Plan section)  Acute Rehab PT Goals Patient Stated Goal: to go home PT Goal Formulation: With patient/family    Frequency       Co-evaluation               AM-PAC PT "6 Clicks" Mobility  Outcome Measure Help needed turning from your back to your side while in a flat bed without using bedrails?: A Little Help needed moving from lying on your back to sitting on the side of a flat bed without using bedrails?: A Little Help needed moving to and from a bed to a chair (including a wheelchair)?: A Little Help needed standing up from a chair using your arms (e.g., wheelchair or bedside chair)?: A Little Help needed to walk in hospital room?: A Little Help needed climbing 3-5 steps with a railing?  : A Little 6 Click Score: 18    End of Session Equipment Utilized During Treatment: Back brace Activity Tolerance: Patient tolerated treatment well Patient left: in bed;with call bell/phone within reach;with family/visitor present (seated EOB) Nurse Communication: Mobility status PT Visit Diagnosis: Muscle weakness (generalized) (M62.81)    Time: 8546-2703 PT Time Calculation (min) (ACUTE ONLY): 18 min   Charges:   PT Evaluation $PT Eval Low Complexity: 1 Low          Treyshon Buchanon A. Dan Humphreys PT, DPT Acute Rehabilitation Services Office [phone]   Viviann Spare 04/05/2022, 10:28 AM

## 2022-04-05 NOTE — Progress Notes (Signed)
    Durable Medical Equipment  (From admission, onward)           Start     Ordered   04/05/22 1103  For home use only DME Walker rolling  Once       Question Answer Comment  Walker: With 5 Inch Wheels   Patient needs a walker to treat with the following condition Gait instability      04/05/22 1104   04/05/22 1103  For home use only DME Bedside commode  Once       Comments: Patient has no toilet at level of residence.  Question:  Patient needs a bedside commode to treat with the following condition  Answer:  Gait instability   04/05/22 1104   04/04/22 1753  DME Walker rolling  Once       Question:  Patient needs a walker to treat with the following condition  Answer:  Degenerative spondylolisthesis   04/04/22 1752   04/04/22 1753  DME 3 n 1  Once        04/04/22 1752

## 2022-04-05 NOTE — Progress Notes (Signed)
Patient is discharged from room 3C05 at this time. IV site d/c'd and instructions read to patient and spouse with understanding verbalized and all questions answered. Left unit via wheelchair with all belongings at side.

## 2022-04-05 NOTE — Care Management (Addendum)
  Transition of Care [facility]) Screening Note   Patient Details  Name: Geoffrey Andrews Date of Birth: May 05, 1956   Transition of Care [facility]) CM/SW Contact:    Lawerance Sabal, RN Phone Number: 04/05/2022, 9:36 AM    Transition of Care Department [facility]) has reviewed patient and no TOC needs have been identified at this time.  Unit staff will provide needed DME for home from floorstock

## 2022-04-05 NOTE — Discharge Summary (Signed)
   Physician Discharge Summary  Patient ID: Geoffrey Andrews MRN: [id number] DOB/AGE: [birth year] 66 y.o.  Admit date: 04/04/2022 Discharge date: 04/05/2022  Admission Diagnoses:  L3-4 degenerative spondylolisthesis with stenosis and radiculopathy  Discharge Diagnoses:  Same Principal Problem:   Degenerative spondylolisthesis   Discharged Condition: Stable  Hospital Course:  Geoffrey Andrews is a 66 y.o. male underwent an elective L3-4 decompression, instrumentation and interbody fusion.  Tolerated surgery well, postoperatively radicular symptoms were improved.  Was ambulating independently.  His pain was controlled on oral medication, tolerating normal diet.  He was having normal bowel bladder function, ambulating independently.  Treatments: Surgery -L3-4 revision laminectomy, posterior lumbar interbody fusion, resection of synovial cyst  Discharge Exam: Blood pressure (!) 108/56, pulse 65, temperature 98.8 F (37.1 C), temperature source Oral, resp. rate 15, height 6\' 1"  (1.854 m), weight 115.2 kg, SpO2 97 %. Awake, alert, orientedx3 Speech fluent, appropriate CN grossly intact 5/5 BUE/BLE Wound c/d/i  Disposition: Discharge disposition: 01-Home or Self Care        Allergies as of 04/05/2022   No Known Allergies      Medication List     STOP taking these medications    ibuprofen 200 MG tablet Commonly known as: ADVIL       TAKE these medications    finasteride 5 MG tablet Commonly known as: PROSCAR Take 1 tablet (5 mg total) by mouth daily.   HYDROcodone-acetaminophen 10-325 MG tablet Commonly known as: NORCO Take 1 tablet by mouth every 6 (six) hours as needed for severe pain ((score 4 to 6)).   methocarbamol 500 MG tablet Commonly known as: ROBAXIN Take 1 tablet (500 mg total) by mouth every 6 (six) hours as needed for muscle spasms.   multivitamin with minerals Tabs tablet Take 1 tablet by mouth in the morning. Men's Multivitamin   OSTEO BI-FLEX  TRIPLE STRENGTH PO Take 1 tablet by mouth in the morning.   sildenafil 100 MG tablet Commonly known as: VIAGRA TAKE 1 TABLET BY MOUTH DAILY AS NEEDED FOR ERECTILE DYSFUNCTION.   tamsulosin 0.4 MG Caps capsule Commonly known as: FLOMAX Take 1 capsule (0.4 mg total) by mouth daily.   Vitamin D3 125 MCG (5000 UT) Tabs Take 5,000 Units by mouth in the morning.               Durable Medical Equipment  (From admission, onward)           Start     Ordered   04/04/22 1753  DME Walker rolling  Once       Question:  Patient needs a walker to treat with the following condition  Answer:  Degenerative spondylolisthesis   04/04/22 1752   04/04/22 1753  DME 3 n 1  Once        04/04/22 1752             Signed: 13/10/23 C Clodagh Odenthal 04/05/2022, 9:12 AM

## 2022-04-05 NOTE — Discharge Instructions (Signed)
Wound Care Keep incision covered and dry for three days.    Do not put any creams, lotions, or ointments on incision. Leave steri-strips on back.  They will fall off by themselves. Activity Walk each and every day, increasing distance each day. No lifting greater than 5 lbs.  Avoid excessive back bending. No driving for 2 weeks; may ride as a passenger locally. If provided with back brace, wear when out of bed.  It is not necessary to wear brace in bed. Diet Resume your normal diet.   Call Your Doctor If Any of These Occur Redness, drainage, or swelling at the wound.  Temperature greater than 101 degrees. Severe pain not relieved by pain medication. Incision starts to come apart. Follow Up Appt Call [phone]) for problems.  If you have any hardware placed in your spine, you will need an x-ray before your appointment.  

## 2022-04-05 NOTE — Evaluation (Signed)
Occupational Therapy Evaluation Patient Details Name: Geoffrey Andrews MRN: [id number] DOB: December 03, 1955 Today's Date: 04/05/2022   History of Present Illness Geoffrey Andrews is a 66 yo male who underwent Reexploration of L3 laminectomy with bilateral decompressive laminotomies and L3-4 posterior lumbar to body fusion on 11/10. PMHx: arthritis, BPH, depression   Clinical Impression   Pt was evaluated s/p the above back surgery, he is typically indep/mod I at baseline and reports a few falls due to LLE weakness. Upon evaluation pt had functional limitations due to pain, knowledge of back precautions and compensatory techniques, generalized weakness and decreased activity tolerance. Overall he required up to min A for LB ADLs with cues and his wife's assistance to maintain back precautions. He ambulated with RW and supervision A with cues for hand placement. He does not required further acute OT. Recommend d/c home with assist from family initially.      Recommendations for follow up therapy are one component of a multi-disciplinary discharge planning process, led by the attending physician.  Recommendations may be updated based on patient status, additional functional criteria and insurance authorization.   Follow Up Recommendations  No OT follow up    Assistance Recommended at Discharge Frequent or constant Supervision/Assistance  Patient can return home with the following A little help with walking and/or transfers;A little help with bathing/dressing/bathroom;Assistance with cooking/housework;Assist for transportation;Help with stairs or ramp for entrance    Functional Status Assessment  Patient has had a recent decline in their functional status and demonstrates the ability to make significant improvements in function in a reasonable and predictable amount of time.  Equipment Recommendations  BSC/3in1;Other (comment) (RW)    Recommendations for Other Services       Precautions / Restrictions  Precautions Precautions: Back;Fall Precaution Booklet Issued: Yes (comment) Required Braces or Orthoses: Spinal Brace Spinal Brace: Applied in sitting position;Lumbar corset Restrictions Weight Bearing Restrictions: No      Mobility Bed Mobility Overal bed mobility: Needs Assistance Bed Mobility: Rolling, Sidelying to Sit Rolling: Supervision Sidelying to sit: Supervision       General bed mobility comments: cues for log roll    Transfers Overall transfer level: Needs assistance Equipment used: Rolling walker (2 wheels) Transfers: Sit to/from Stand Sit to Stand: Supervision           General transfer comment: cues for hand placement      Balance Overall balance assessment: Needs assistance Sitting-balance support: Feet supported Sitting balance-Leahy Scale: Good     Standing balance support: No upper extremity supported, During functional activity Standing balance-Leahy Scale: Fair                             ADL either performed or assessed with clinical judgement   ADL Overall ADL's : Needs assistance/impaired Eating/Feeding: Independent   Grooming: Supervision/safety   Upper Body Bathing: Set up;Sitting   Lower Body Bathing: Minimal assistance;Sit to/from stand   Upper Body Dressing : Set up;Sitting   Lower Body Dressing: Minimal assistance;Sit to/from stand   Toilet Transfer: Supervision/safety;Ambulation;Rolling walker (2 wheels)   Toileting- Clothing Manipulation and Hygiene: Supervision/safety;Sitting/lateral lean       Functional mobility during ADLs: Supervision/safety;Rolling walker (2 wheels) General ADL Comments: cues provided throughout for back precautions and compensatory tehcniques. min A for LB ADLs, wife demonstrated ability to assist     Vision Baseline Vision/History: 1 Wears glasses Ability to See in Adequate Light: 0 Adequate Vision Assessment?: No apparent visual  deficits     Perception     Praxis       Pertinent Vitals/Pain Pain Assessment Pain Assessment: Faces Faces Pain Scale: Hurts a little bit Pain Location: back Pain Descriptors / Indicators: Grimacing, Guarding Pain Intervention(s): Limited activity within patient's tolerance, Monitored during session     Hand Dominance Right   Extremity/Trunk Assessment Upper Extremity Assessment Upper Extremity Assessment: Overall WFL for tasks assessed   Lower Extremity Assessment Lower Extremity Assessment: Generalized weakness   Cervical / Trunk Assessment Cervical / Trunk Assessment: Back Surgery   Communication Communication Communication: HOH   Cognition Arousal/Alertness: Awake/alert Behavior During Therapy: WFL for tasks assessed/performed Overall Cognitive Status: Within Functional Limits for tasks assessed                                       General Comments  VSS on RA, wife present    Exercises     Shoulder Instructions      Home Living Family/patient expects to be discharged to:: Private residence Living Arrangements: Spouse/significant other Available Help at Discharge: Family;Available PRN/intermittently Type of Home: House Home Access: Stairs to enter [employer] of Steps: 2 Entrance Stairs-Rails: None Home Layout: One level     Bathroom Shower/Tub: Producer, television/film/video: Handicapped height Bathroom Accessibility: Yes How Accessible: Accessible via walker Home Equipment: Standard Walker;Cane - single point   Additional Comments: daughter is an Charity fundraiser at [employer]      Prior Functioning/Environment Prior Level of Function : Independent/Modified Independent;History of Falls (last six months)             Mobility Comments: 3 falls in 3 months, intermittent use of SPC/walker ADLs Comments: indep        OT Problem List: Decreased strength;Decreased range of motion;Decreased activity tolerance;Impaired balance (sitting and/or standing);Decreased safety  awareness;Decreased knowledge of use of DME or AE;Decreased knowledge of precautions;Pain      OT Treatment/Interventions: Self-care/ADL training;Therapeutic exercise;DME and/or AE instruction;Therapeutic activities;Balance training;Patient/family education    OT Goals(Current goals can be found in the care plan section) Acute Rehab OT Goals Patient Stated Goal: home OT Goal Formulation: With patient Time For Goal Achievement: 04/19/22 Potential to Achieve Goals: Good  OT Frequency: Min 2X/week    Co-evaluation              AM-PAC OT "6 Clicks" Daily Activity     Outcome Measure Help from another person eating meals?: None Help from another person taking care of personal grooming?: A Little Help from another person toileting, which includes using toliet, bedpan, or urinal?: A Little Help from another person bathing (including washing, rinsing, drying)?: A Little Help from another person to put on and taking off regular upper body clothing?: None Help from another person to put on and taking off regular lower body clothing?: A Little 6 Click Score: 20   End of Session Equipment Utilized During Treatment: Rolling walker (2 wheels);Back brace Nurse Communication: Mobility status  Activity Tolerance: Patient tolerated treatment well Patient left: in bed;with call bell/phone within reach;with family/visitor present  OT Visit Diagnosis: Unsteadiness on feet (R26.81);Other abnormalities of gait and mobility (R26.89);Muscle weakness (generalized) (M62.81);Pain                Time: 1761-6073 OT Time Calculation (min): 19 min Charges:  OT General Charges $OT Visit: 1 Visit OT Evaluation $OT Eval Moderate Complexity: 1 Mod  Donia Pounds 04/05/2022, 9:08 AM

## 2022-04-07 ENCOUNTER — Other Ambulatory Visit: Payer: Self-pay

## 2022-04-10 NOTE — Anesthesia Postprocedure Evaluation (Signed)
Anesthesia Post Note  Patient: CHRSTOPHER MALENFANT  Procedure(s) Performed: Posterior Lumbar Interbody Fusion - Lumbar three-Lumbar four (Back)     Patient location during evaluation: PACU Anesthesia Type: General Level of consciousness: awake and alert Pain management: pain level controlled Vital Signs Assessment: post-procedure vital signs reviewed and stable Respiratory status: spontaneous breathing, nonlabored ventilation and respiratory function stable Cardiovascular status: stable and blood pressure returned to baseline Anesthetic complications: yes   Encounter Notable Events  Notable Event Outcome Phase Comment  Difficult to intubate - expected  Intraprocedure Filed from anesthesia note documentation.    Last Vitals:  Vitals:   04/05/22 0352 04/05/22 0740  BP: 120/64 (!) 108/56  Pulse: (!) 59 65  Resp: 20 15  Temp: 36.9 C 37.1 C  SpO2: 99% 97%    Last Pain:  Vitals:   04/05/22 1005  TempSrc:   PainSc: 6    Pain Goal: Patients Stated Pain Goal: 3 (04/05/22 0310)                 Beryle Lathe

## 2022-04-15 MED FILL — Sodium Chloride IV Soln 0.9%: INTRAVENOUS | Qty: 1000 | Status: AC

## 2022-04-15 MED FILL — Heparin Sodium (Porcine) Inj 1000 Unit/ML: INTRAMUSCULAR | Qty: 30 | Status: AC

## 2022-04-27 ENCOUNTER — Other Ambulatory Visit: Payer: Self-pay | Admitting: Urology

## 2022-04-27 DIAGNOSIS — N400 Enlarged prostate without lower urinary tract symptoms: Secondary | ICD-10-CM

## 2022-04-27 MED ORDER — TAMSULOSIN HCL 0.4 MG PO CAPS: 0.4000 mg | ORAL_CAPSULE | Freq: Every day | ORAL | 3 refills | Status: DC

## 2022-04-27 MED ORDER — FINASTERIDE 5 MG PO TABS: 5.0000 mg | ORAL_TABLET | Freq: Every day | ORAL | Status: DC

## 2022-06-26 ENCOUNTER — Other Ambulatory Visit: Payer: Self-pay | Admitting: Urology

## 2022-06-26 DIAGNOSIS — N400 Enlarged prostate without lower urinary tract symptoms: Secondary | ICD-10-CM

## 2023-01-05 NOTE — Progress Notes (Signed)
History of Present Illness: Here for follow-up of BPH as well as ED.  He is on both finasteride and tamsulosin.  Also takes sildenafil.  Last PSA done in September 2022 was 1.0: (Corrected value 2.0)  His in laws have moved in with him recently-his father-in-law has significant dementia.  Overall, pleased with his voiding although IPSS 19.  Does not always feel like he empties out well.    Past Medical History:  Diagnosis Date   Arthritis    BPH (benign prostatic hyperplasia)    Complication of anesthesia    Slow to awaken.  [name] very few mediations )   De Quervain's syndrome (tenosynovitis) 12/29/13   Left   Depression    situational    Past Surgical History:  Procedure Laterality Date   CERVICAL FUSION  05/26/2008   CERVICAL LAMINECTOMY  05/26/1988   COLONOSCOPY W/ POLYPECTOMY     LUMBAR LAMINECTOMY/DECOMPRESSION MICRODISCECTOMY N/A 03/08/2015   Procedure: Lumbar vertebral three to four, lumbar vertebral four to five lumbar decompression;  Surgeon: Aliene Beams, MD;  Location: [facility];  Service: Neurosurgery;  Laterality: N/A;   Male Fertility     testicular biopsy at age 82   NASAL SINUS SURGERY      Home Medications:  Allergies as of 01/06/2023   No Known Allergies      Medication List        Accurate as of January 05, 2023 12:16 PM. If you have any questions, ask your nurse or doctor.          finasteride 5 MG tablet Commonly known as: PROSCAR TAKE (1) TABLET BY MOUTH ONCE DAILY.   HYDROcodone-acetaminophen 10-325 MG tablet Commonly known as: NORCO Take 1 tablet by mouth every 6 (six) hours as needed for severe pain ((score 4 to 6)).   methocarbamol 500 MG tablet Commonly known as: ROBAXIN Take 1 tablet (500 mg total) by mouth every 6 (six) hours as needed for muscle spasms.   multivitamin with minerals Tabs tablet Take 1 tablet by mouth in the morning. Men's Multivitamin   OSTEO BI-FLEX TRIPLE STRENGTH PO Take 1 tablet by mouth in the  morning.   sildenafil 100 MG tablet Commonly known as: VIAGRA TAKE 1 TABLET BY MOUTH DAILY AS NEEDED FOR ERECTILE DYSFUNCTION.   tamsulosin 0.4 MG Caps capsule Commonly known as: FLOMAX Take 1 capsule (0.4 mg total) by mouth daily.   Vitamin D3 125 MCG (5000 UT) Tabs Take 5,000 Units by mouth in the morning.        Allergies: No Known Allergies  No family history on file.  Social History:  reports that he has never smoked. He has never used smokeless tobacco. He reports that he does not drink alcohol and does not use drugs.  ROS: A complete review of systems was performed.  All systems are negative except for pertinent findings as noted.  Physical Exam:  Vital signs in last 24 hours: There were no vitals taken for this visit. Constitutional:  Alert and oriented, No acute distress Cardiovascular: Regular rate  Respiratory: Normal respiratory effort GI: Abdomen is soft, nontender, nondistended, no abdominal masses. No CVAT.  Genitourinary: Normal male phallus, testes are descended bilaterally and non-tender and without masses, scrotum is normal in appearance without lesions or masses, perineum is normal on inspection. Lymphatic: No lymphadenopathy Neurologic: Grossly intact, no focal deficits Psychiatric: Normal mood and affect  I have reviewed prior pt notes  I have reviewed urinalysis results  I have independently reviewed  bladder scan-210 mL  I have reviewed prior PSA results  I have reviewed IPSS form--19/1   Impression/Assessment:  1.  Prostate cancer screening.  Patient's father died from prostate cancer.  Last PSA that I could see was 2 years ago although he said PSA from PCP was normal.  He is on finasteride  2.  BPH with moderate symptomatology, at this point not seriously bothersome.  He does have an elevated residual urine volume.  Plan:  1.  He will continue on finasteride and tamsulosin  2.  I will get most recent PSA from his PCP  3.  I will see  him back in about 6 months just to check his residual urine volume.  If elevating trend, consider intervention other than medical therapy

## 2023-01-06 ENCOUNTER — Ambulatory Visit: Payer: Medicare Other | Admitting: Urology

## 2023-01-06 VITALS — BP 111/73 | HR 66

## 2023-01-06 DIAGNOSIS — N401 Enlarged prostate with lower urinary tract symptoms: Secondary | ICD-10-CM

## 2023-01-06 DIAGNOSIS — N521 Erectile dysfunction due to diseases classified elsewhere: Secondary | ICD-10-CM | POA: Diagnosis not present

## 2023-01-06 DIAGNOSIS — R3915 Urgency of urination: Secondary | ICD-10-CM | POA: Diagnosis not present

## 2023-01-06 DIAGNOSIS — Z125 Encounter for screening for malignant neoplasm of prostate: Secondary | ICD-10-CM | POA: Diagnosis not present

## 2023-01-06 DIAGNOSIS — N400 Enlarged prostate without lower urinary tract symptoms: Secondary | ICD-10-CM

## 2023-01-06 LAB — URINALYSIS, ROUTINE W REFLEX MICROSCOPIC
Bilirubin, UA: NEGATIVE
Glucose, UA: NEGATIVE
Ketones, UA: NEGATIVE
Leukocytes,UA: NEGATIVE
Nitrite, UA: NEGATIVE
Protein,UA: NEGATIVE
RBC, UA: NEGATIVE
Specific Gravity, UA: 1.01 (ref 1.005–1.030)
Urobilinogen, Ur: 0.2 mg/dL (ref 0.2–1.0)
pH, UA: 7 (ref 5.0–7.5)

## 2023-01-07 ENCOUNTER — Other Ambulatory Visit: Payer: Self-pay | Admitting: Urology

## 2023-01-07 DIAGNOSIS — N400 Enlarged prostate without lower urinary tract symptoms: Secondary | ICD-10-CM

## 2023-01-09 ENCOUNTER — Telehealth: Payer: Self-pay

## 2023-01-09 NOTE — Telephone Encounter (Signed)
Patient made aware and voiced understanding.

## 2023-01-09 NOTE — Telephone Encounter (Signed)
-----   Message from Bertram Millard Dahlstedt sent at 01/07/2023  4:50 PM EDT ----- Call patient-his PCP does not have any PSA on him.  Put an order for him to have 1 drawn here in the next week.

## 2023-01-13 ENCOUNTER — Other Ambulatory Visit: Payer: Medicare Other

## 2023-01-13 DIAGNOSIS — N400 Enlarged prostate without lower urinary tract symptoms: Secondary | ICD-10-CM

## 2023-01-14 LAB — PSA: Prostate Specific Ag, Serum: 0.6 ng/mL (ref 0.0–4.0)

## 2023-01-20 ENCOUNTER — Telehealth: Payer: Self-pay

## 2023-01-20 NOTE — Telephone Encounter (Signed)
Patient is made aware of Dr. Retta Diones  response"PSA is low at 0.6.  We must double out as you are on finasteride.  However, still a very low level.  Good news." Voiced understanding.

## 2023-07-07 ENCOUNTER — Other Ambulatory Visit: Payer: Medicare Other | Admitting: Urology

## 2023-07-19 NOTE — Progress Notes (Signed)
 History of Present Illness: Here for follow-up of BPH as well as ED.    BPH is managed w/ both finasteride and tamsulosin.  He also takes sildenafil.  Last PSA done in September 2022 was 1.0: (Corrected value 2.0)  At visit 6 mos ago pvr was 210 mL.    Past Medical History:  Diagnosis Date   Arthritis    BPH (benign prostatic hyperplasia)    Complication of anesthesia    Slow to awaken.  Geoffrey Andrews very few mediations )   De Quervain's syndrome (tenosynovitis) 12/29/13   Left   Depression    situational    Past Surgical History:  Procedure Laterality Date   CERVICAL FUSION  05/26/2008   CERVICAL LAMINECTOMY  05/26/1988   COLONOSCOPY W/ POLYPECTOMY     LUMBAR LAMINECTOMY/DECOMPRESSION MICRODISCECTOMY N/A 03/08/2015   Procedure: Lumbar vertebral three to four, lumbar vertebral four to five lumbar decompression;  Surgeon: Aliene Beams, MD;  Location: [facility];  Service: Neurosurgery;  Laterality: N/A;   Male Fertility     testicular biopsy at age 49   NASAL SINUS SURGERY      Home Medications:  Allergies as of 07/21/2023   No Known Allergies      Medication List        Accurate as of July 19, 2023  9:47 AM. If you have any questions, ask your nurse or doctor.          finasteride 5 MG tablet Commonly known as: PROSCAR TAKE (1) TABLET BY MOUTH ONCE DAILY.   HYDROcodone-acetaminophen 10-325 MG tablet Commonly known as: NORCO Take 1 tablet by mouth every 6 (six) hours as needed for severe pain ((score 4 to 6)).   methocarbamol 500 MG tablet Commonly known as: ROBAXIN Take 1 tablet (500 mg total) by mouth every 6 (six) hours as needed for muscle spasms.   multivitamin with minerals Tabs tablet Take 1 tablet by mouth in the morning. Men's Multivitamin   OSTEO BI-FLEX TRIPLE STRENGTH PO Take 1 tablet by mouth in the morning.   sildenafil 100 MG tablet Commonly known as: VIAGRA TAKE 1 TABLET BY MOUTH DAILY AS NEEDED FOR ERECTILE DYSFUNCTION.    tamsulosin 0.4 MG Caps capsule Commonly known as: FLOMAX TAKE (1) CAPSULE BY MOUTH ONCE DAILY.   Vitamin D3 125 MCG (5000 UT) Tabs Take 5,000 Units by mouth in the morning.        Allergies: No Known Allergies  No family history on file.  Social History:  reports that he has never smoked. He has never used smokeless tobacco. He reports that he does not drink alcohol and does not use drugs.  ROS: A complete review of systems was performed.  All systems are negative except for pertinent findings as noted.  Physical Exam:  Vital signs in last 24 hours: There were no vitals taken for this visit. Constitutional:  Alert and oriented, No acute distress Cardiovascular: Regular rate  Respiratory: Normal respiratory effort GI: Abdomen is soft, nontender, nondistended, no abdominal masses. No CVAT.  Genitourinary: Normal male phallus, testes are descended bilaterally and non-tender and without masses, scrotum is normal in appearance without lesions or masses, perineum is normal on inspection. Lymphatic: No lymphadenopathy Neurologic: Grossly intact, no focal deficits Psychiatric: Normal mood and affect  I have reviewed prior pt notes  I have reviewed urinalysis results  I have independently reviewed bladder scan  I have reviewed prior PSA results  I have reviewed IPSS form--   Impression/Assessment:  1.  Prostate cancer screening.  Patient's father died from prostate cancer.  Last PSA that I could see was 2 years ago although he said PSA from PCP was normal.  He is on finasteride  2.  BPH with moderate symptomatology, at this point not seriously bothersome.  He does have an elevated residual urine volume.  Plan:  1.

## 2023-07-21 ENCOUNTER — Ambulatory Visit: Payer: Medicare Other | Admitting: Urology

## 2023-07-21 ENCOUNTER — Encounter: Payer: Self-pay | Admitting: Urology

## 2023-07-21 VITALS — BP 123/72 | HR 75

## 2023-07-21 DIAGNOSIS — N400 Enlarged prostate without lower urinary tract symptoms: Secondary | ICD-10-CM

## 2023-07-21 DIAGNOSIS — N401 Enlarged prostate with lower urinary tract symptoms: Secondary | ICD-10-CM

## 2023-07-21 DIAGNOSIS — N521 Erectile dysfunction due to diseases classified elsewhere: Secondary | ICD-10-CM

## 2023-07-21 DIAGNOSIS — R3915 Urgency of urination: Secondary | ICD-10-CM

## 2023-07-21 DIAGNOSIS — R339 Retention of urine, unspecified: Secondary | ICD-10-CM | POA: Diagnosis not present

## 2023-07-21 LAB — URINALYSIS, ROUTINE W REFLEX MICROSCOPIC
Bilirubin, UA: NEGATIVE
Glucose, UA: NEGATIVE
Ketones, UA: NEGATIVE
Leukocytes,UA: NEGATIVE
Nitrite, UA: NEGATIVE
Protein,UA: NEGATIVE
RBC, UA: NEGATIVE
Specific Gravity, UA: 1.015 (ref 1.005–1.030)
Urobilinogen, Ur: 0.2 mg/dL (ref 0.2–1.0)
pH, UA: 7.5 (ref 5.0–7.5)

## 2023-07-21 LAB — BLADDER SCAN AMB NON-IMAGING: Scan Result: 1

## 2023-07-21 MED ORDER — FINASTERIDE 5 MG PO TABS: 5.0000 mg | ORAL_TABLET | Freq: Every day | ORAL | 3 refills | Status: DC

## 2023-07-21 NOTE — Progress Notes (Signed)
 Bladder Scan completed today.  Patient can void prior to the bladder scan. Bladder scan result: 1  Performed By: Alfonse Spruce. CMA  Additional notes-

## 2023-10-07 ENCOUNTER — Other Ambulatory Visit (HOSPITAL_COMMUNITY): Payer: Self-pay | Admitting: Adult Health

## 2023-10-07 ENCOUNTER — Ambulatory Visit (HOSPITAL_COMMUNITY)
Admission: RE | Admit: 2023-10-07 | Discharge: 2023-10-07 | Disposition: A | Discharge status: Home / Self Care | Source: Ambulatory Visit | Attending: Adult Health | Admitting: Adult Health

## 2023-10-07 DIAGNOSIS — M25562 Pain in left knee: Secondary | ICD-10-CM | POA: Insufficient documentation

## 2023-11-06 ENCOUNTER — Other Ambulatory Visit (HOSPITAL_COMMUNITY): Payer: Self-pay

## 2023-11-06 DIAGNOSIS — M25562 Pain in left knee: Secondary | ICD-10-CM

## 2023-11-13 ENCOUNTER — Encounter (HOSPITAL_COMMUNITY): Payer: Self-pay

## 2023-11-13 ENCOUNTER — Ambulatory Visit (HOSPITAL_COMMUNITY)

## 2023-12-09 ENCOUNTER — Encounter (INDEPENDENT_AMBULATORY_CARE_PROVIDER_SITE_OTHER): Payer: Self-pay | Admitting: *Deleted

## 2023-12-15 ENCOUNTER — Telehealth: Payer: Self-pay

## 2023-12-15 NOTE — Telephone Encounter (Signed)
 Who is your primary care physician: [facility]  Reasons for the colonoscopy: HX polyps  Have you had a colonoscopy before?  Yes [facility]  Do you have family history of colon cancer? Yes Mother  Previous colonoscopy with polyps removed? Yes 2014  and 2019  Do you have a history colorectal cancer?   no  Are you diabetic? If yes, Type 1 or Type 2?    no  Do you have a prosthetic or mechanical heart valve? no  Do you have a pacemaker/defibrillator?   no  Have you had endocarditis/atrial fibrillation? no  Have you had joint replacement within the last 12 months?  no  Do you tend to be constipated or have to use laxatives? yes  Do you have any history of drugs or alchohol?  no  Do you use supplemental oxygen?  no  Have you had a stroke or heart attack within the last 6 months? no  Do you take weight loss medication?  no    Do you take any blood-thinning medications such as: (aspirin, warfarin, Plavix, Aggrenox)  no  If yes we need the name, milligram, dosage and who is prescribing doctor  Current Outpatient Medications on File Prior to Visit  Medication Sig Dispense Refill   Cholecalciferol  (VITAMIN D3) 125 MCG (5000 UT) TABS Take 5,000 Units by mouth in the morning.     finasteride  (PROSCAR ) 5 MG tablet Take 1 tablet (5 mg total) by mouth daily. 100 tablet 3   HYDROcodone -acetaminophen  (NORCO) 10-325 MG tablet Take 1 tablet by mouth every 6 (six) hours as needed for severe pain ((score 4 to 6)). (Patient not taking: Reported on 07/21/2023) 30 tablet 0   methocarbamol  (ROBAXIN ) 500 MG tablet Take 1 tablet (500 mg total) by mouth every 6 (six) hours as needed for muscle spasms. (Patient not taking: Reported on 07/21/2023) 90 tablet 1   Misc Natural Products (OSTEO BI-FLEX TRIPLE STRENGTH PO) Take 1 tablet by mouth in the morning.     Multiple Vitamin (MULTIVITAMIN WITH MINERALS) TABS tablet Take 1 tablet by mouth in the morning. Men's Multivitamin      sildenafil  (VIAGRA ) 100 MG tablet TAKE 1 TABLET BY MOUTH DAILY AS NEEDED FOR ERECTILE DYSFUNCTION. 10 tablet PRN   tamsulosin  (FLOMAX ) 0.4 MG CAPS capsule TAKE (1) CAPSULE BY MOUTH ONCE DAILY. 90 capsule 3   No current facility-administered medications on file prior to visit.    No Known Allergies   Pharmacy: [facility]  Primary Insurance Name: CHARON [id number]  Best number where you can be reached: [phone]

## 2024-01-19 ENCOUNTER — Telehealth (INDEPENDENT_AMBULATORY_CARE_PROVIDER_SITE_OTHER): Payer: Self-pay | Admitting: Gastroenterology

## 2024-01-19 NOTE — Telephone Encounter (Signed)
 Pt informed that questionnaire is in review and once providers send it back to us , we will be in touch to get him scheduled.

## 2024-01-19 NOTE — Telephone Encounter (Signed)
 Patient called to follow up on his questionnaire. It has been in review since July 22 and he is wanting to schedule. Please call [phone]

## 2024-01-20 NOTE — Telephone Encounter (Signed)
 Left a message to schedule OV before colonoscopy because of constipation

## 2024-01-20 NOTE — Telephone Encounter (Signed)
 Needs OV due to constipation.

## 2024-03-16 NOTE — Progress Notes (Unsigned)
 GI Office Note    Referring Provider: Nsumanganyi, [place] Ce* Primary Care Physician:  Nsumanganyi, [place] Cesar, NP  Primary Gastroenterologist: Carlin POUR. Cindie, DO  Chief Complaint   No chief complaint on file.  History of Present Illness   Geoffrey Andrews is a 68 y.o. male presenting today at the request of Nsumanganyi, [place] Ce* for screening colonoscopy for colon cancer.  No prior colonoscopy on file within our system. Noted constipation on triage questionnaire.   Referral paperwork notes possible prior colonoscopy.  Today:  Discussed the use of AI scribe software for clinical note transcription with the patient, who gave verbal consent to proceed.    Wt Readings from Last 6 Encounters:  04/04/22 254 lb (115.2 kg)  03/28/22 264 lb 8 oz (120 kg)  01/07/22 253 lb (114.8 kg)  01/01/21 244 lb 4 oz (110.8 kg)  12/06/19 248 lb (112.5 kg)  03/08/15 253 lb (114.8 kg)    There is no height or weight on file to calculate BMI.  No current outpatient medications on file.   No current facility-administered medications for this visit.    Past Medical History:  Diagnosis Date   Arthritis    BPH (benign prostatic hyperplasia)    Complication of anesthesia    Slow to awaken.  ETTER Endo very few mediations )   De Quervain's syndrome (tenosynovitis) 12/29/13   Left   Depression    situational    Past Surgical History:  Procedure Laterality Date   CERVICAL FUSION  05/26/2008   CERVICAL LAMINECTOMY  05/26/1988   COLONOSCOPY W/ POLYPECTOMY     LUMBAR LAMINECTOMY/DECOMPRESSION MICRODISCECTOMY N/A 03/08/2015   Procedure: Lumbar vertebral three to four, lumbar vertebral four to five lumbar decompression;  Surgeon: Darina Boehringer, MD;  Location: [facility];  Service: Neurosurgery;  Laterality: N/A;   Male Fertility     testicular biopsy at age 31   NASAL SINUS SURGERY      No family history on file.  Allergies as of 03/17/2024   (No Known Allergies)    Social  History   Socioeconomic History   Marital status: Married    Spouse name: Not on file   Number of children: Not on file   Years of education: Not on file   Highest education level: Not on file  Occupational History   Not on file  Tobacco Use   Smoking status: Never   Smokeless tobacco: Never  Vaping Use   Vaping status: Never Used  Substance and Sexual Activity   Alcohol use: No    Alcohol/week: 0.0 standard drinks of alcohol   Drug use: No   Sexual activity: Not on file  Other Topics Concern   Not on file  Social History Narrative   Not on file   Social Drivers of Health   Financial Resource Strain: Not on file  Food Insecurity: No Food Insecurity (04/05/2022)   Hunger Vital Sign    Worried About Running Out of Food in the Last Year: Never true    Ran Out of Food in the Last Year: Never true  Transportation Needs: No Transportation Needs (04/05/2022)   PRAPARE - Administrator, Civil Service (Medical): No    Lack of Transportation (Non-Medical): No  Physical Activity: Not on file  Stress: Not on file  Social Connections: Not on file  Intimate Partner Violence: Not At Risk (04/05/2022)   Humiliation, Afraid, Rape, and Kick questionnaire    Fear of Current or Ex-Partner:  No    Emotionally Abused: No    Physically Abused: No    Sexually Abused: No    Review of Systems   Gen: Denies any fever, chills, fatigue, weight loss, lack of appetite.  CV: Denies chest pain, heart palpitations, peripheral edema, syncope.  Resp: Denies shortness of breath at rest or with exertion. Denies wheezing or cough.  GI: see HPI GU : Denies urinary burning, urinary frequency, urinary hesitancy MS: Denies joint pain, muscle weakness, cramps, or limitation of movement.  Derm: Denies rash, itching, dry skin Psych: Denies depression, anxiety, memory loss, and confusion Heme: Denies bruising, bleeding, and enlarged lymph nodes.  Physical Exam   There were no vitals taken for  this visit.  General:   Alert and oriented. Pleasant and cooperative. Well-nourished and well-developed.  Head:  Normocephalic and atraumatic. Eyes:  Without icterus, sclera clear and conjunctiva pink.  Ears:  Normal auditory acuity. Mouth:  No deformity or lesions, oral mucosa pink.  Lungs:  Clear to auscultation bilaterally. No wheezes, rales, or rhonchi. No distress.  Heart:  S1, S2 present without murmurs appreciated.  Abdomen:  +BS, soft, non-tender and non-distended. No HSM noted. No guarding or rebound. No masses appreciated.  Rectal:  deferred *** Msk:  Symmetrical without gross deformities. Normal posture. Extremities:  Without edema. Neurologic:  Alert and  oriented x4;  grossly normal neurologically. Skin:  Intact without significant lesions or rashes. Psych:  Alert and cooperative. Normal mood and affect.  Assessment & Plan   Geoffrey Andrews is a 68 y.o. male with a history of BPH, vitamin D  deficiency, de Quervain syndrome, depression, and arthritis presenting today with need to schedule colonoscopy but with constipation.     Follow up   ***Follow up ***   Charmaine Melia, MSN, FNP-BC, AGACNP-BC [facility]

## 2024-03-17 ENCOUNTER — Ambulatory Visit: Admitting: Gastroenterology

## 2024-03-17 ENCOUNTER — Other Ambulatory Visit: Payer: Self-pay | Admitting: *Deleted

## 2024-03-17 ENCOUNTER — Encounter: Payer: Self-pay | Admitting: *Deleted

## 2024-03-17 ENCOUNTER — Telehealth: Payer: Self-pay | Admitting: *Deleted

## 2024-03-17 ENCOUNTER — Encounter: Payer: Self-pay | Admitting: Gastroenterology

## 2024-03-17 VITALS — BP 138/88 | HR 61 | Temp 97.8°F | Ht 73.0 in | Wt 265.8 lb

## 2024-03-17 DIAGNOSIS — Z860100 Personal history of colon polyps, unspecified: Secondary | ICD-10-CM

## 2024-03-17 DIAGNOSIS — K5900 Constipation, unspecified: Secondary | ICD-10-CM | POA: Diagnosis not present

## 2024-03-17 DIAGNOSIS — Z800 Family history of malignant neoplasm of digestive organs: Secondary | ICD-10-CM

## 2024-03-17 MED ORDER — PEG 3350-KCL-NA BICARB-NACL 420 G PO SOLR: 4000.0000 mL | Freq: Once | ORAL | 0 refills | Status: AC

## 2024-03-17 NOTE — Patient Instructions (Signed)
 We will get you scheduled for colonoscopy in the near future with Dr. Geronimo.  My goal for you would be at least 60 ounces of water  daily and following high-fiber diet.  If you like cook carrots you can continue that as most of the fibrous content in carrots is going what you cook them and cooking them does not make it more easily to digest.  If you have difficulty in getting a high amount of fiber in your diet you could supplement with Metamucil or psyllium either in tablet form or powder form.  It was a pleasure to see you today. I want to create trusting relationships with patients. If you receive a survey regarding your visit,  I greatly appreciate you taking time to fill this out on paper or through your MyChart. I value your feedback.  Charmaine Melia, MSN, FNP-BC, AGACNP-BC [facility]

## 2024-03-17 NOTE — Telephone Encounter (Signed)
LMOVM to call back to schedule TCS with Dr. Carver,  ASA2 

## 2024-03-17 NOTE — Telephone Encounter (Signed)
 Pt has been scheduled for 04/12/24. Instructions  mailed and prep sent to pharmacy.

## 2024-03-18 ENCOUNTER — Other Ambulatory Visit: Payer: Self-pay | Admitting: Urology

## 2024-04-11 NOTE — Telephone Encounter (Signed)
 Pt is cancelling his procedure for tomorrow. He says he has had vertigo all weekend and is currently taking medication for this. He also says his father in law is in hospice care and not doing good. He wants to be rescheduled in Jan.  Advised pt will call when we get providers schedule for Jan. Pt verbalized understanding.

## 2024-04-12 ENCOUNTER — Encounter (HOSPITAL_COMMUNITY): Admission: RE | Payer: Self-pay | Source: Home / Self Care

## 2024-04-12 ENCOUNTER — Ambulatory Visit (HOSPITAL_COMMUNITY): Admission: RE | Admit: 2024-04-12 | Admitting: Internal Medicine

## 2024-04-12 SURGERY — COLONOSCOPY
Anesthesia: Choice

## 2024-05-06 NOTE — Telephone Encounter (Signed)
 Pt says he is not at home and will need to check in calendar. He was given some dates for Jan. He will give office a call back on Monday.

## 2024-05-11 ENCOUNTER — Encounter: Payer: Self-pay | Admitting: *Deleted

## 2024-05-11 NOTE — Telephone Encounter (Signed)
 Pt has been rescheduled for 06/07/24. Instructions mailed. Pt had prep from previously being scheduled

## 2024-06-02 ENCOUNTER — Encounter (HOSPITAL_COMMUNITY)
Admission: RE | Admit: 2024-06-02 | Discharge: 2024-06-02 | Disposition: A | Discharge status: Home / Self Care | Payer: Self-pay | Source: Ambulatory Visit | Attending: Internal Medicine | Admitting: Internal Medicine

## 2024-06-02 ENCOUNTER — Encounter (HOSPITAL_COMMUNITY): Payer: Self-pay

## 2024-06-07 ENCOUNTER — Ambulatory Visit (HOSPITAL_COMMUNITY): Payer: Self-pay | Admitting: Anesthesiology

## 2024-06-07 ENCOUNTER — Encounter (HOSPITAL_COMMUNITY)
Admission: RE | Disposition: A | Discharge status: Home / Self Care | Payer: Self-pay | Source: Home / Self Care | Attending: Internal Medicine

## 2024-06-07 ENCOUNTER — Encounter (HOSPITAL_COMMUNITY): Payer: Self-pay | Admitting: Anesthesiology

## 2024-06-07 ENCOUNTER — Ambulatory Visit (HOSPITAL_COMMUNITY)
Admission: RE | Admit: 2024-06-07 | Discharge: 2024-06-07 | Disposition: A | Discharge status: Home / Self Care | Payer: Self-pay | Attending: Internal Medicine | Admitting: Internal Medicine

## 2024-06-07 ENCOUNTER — Encounter (INDEPENDENT_AMBULATORY_CARE_PROVIDER_SITE_OTHER): Payer: Self-pay | Admitting: *Deleted

## 2024-06-07 DIAGNOSIS — K635 Polyp of colon: Secondary | ICD-10-CM | POA: Diagnosis not present

## 2024-06-07 DIAGNOSIS — Z800 Family history of malignant neoplasm of digestive organs: Secondary | ICD-10-CM | POA: Diagnosis not present

## 2024-06-07 DIAGNOSIS — Z860101 Personal history of adenomatous and serrated colon polyps: Secondary | ICD-10-CM

## 2024-06-07 DIAGNOSIS — Z1211 Encounter for screening for malignant neoplasm of colon: Secondary | ICD-10-CM | POA: Insufficient documentation

## 2024-06-07 DIAGNOSIS — K648 Other hemorrhoids: Secondary | ICD-10-CM | POA: Insufficient documentation

## 2024-06-07 DIAGNOSIS — D123 Benign neoplasm of transverse colon: Secondary | ICD-10-CM

## 2024-06-07 DIAGNOSIS — D120 Benign neoplasm of cecum: Secondary | ICD-10-CM | POA: Insufficient documentation

## 2024-06-07 HISTORY — PX: POLYPECTOMY: SHX149

## 2024-06-07 HISTORY — PX: COLONOSCOPY: SHX5424

## 2024-06-07 LAB — HM COLONOSCOPY

## 2024-06-07 MED ORDER — PROPOFOL 500 MG/50ML IV EMUL
INTRAVENOUS | Status: DC | PRN
  Administered 2024-06-07: 100 mg via INTRAVENOUS
  Administered 2024-06-07: 100 ug/kg/min via INTRAVENOUS

## 2024-06-07 MED ORDER — LACTATED RINGERS IV SOLN: INTRAVENOUS | Status: DC

## 2024-06-07 NOTE — Anesthesia Procedure Notes (Signed)
 Date/Time: 06/07/2024 8:28 AM  Performed by: Barbarann Verneita RAMAN, CRNAPre-anesthesia Checklist: Patient identified, Emergency Drugs available, Suction available, Timeout performed and Patient being monitored Patient Re-evaluated:Patient Re-evaluated prior to induction Oxygen Delivery Method: Nasal Cannula

## 2024-06-07 NOTE — H&P (Signed)
 " Primary Care Physician:  Benjamin Raina Elizabeth, NP Primary Gastroenterologist:  Dr. Cindie  Pre-Procedure History & Physical: HPI:  Geoffrey Andrews is a 69 y.o. male is here for a colonoscopy to be performed for Colorectal cancer screening, history of colon polyps, and family history of colon cancer.  His last colonoscopy was six years ago with no polyps found, but previous colonoscopies revealed polyps, including one precancerous. Significant family history of colorectal cancer includes his mother, maternal uncle, and maternal aunt.  Past Medical History:  Diagnosis Date   Arthritis    BPH (benign prostatic hyperplasia)    Complication of anesthesia    Slow to awaken.  ETTER Endo very few mediations )   De Quervain's syndrome (tenosynovitis) 12/29/13   Left   Depression    situational    Past Surgical History:  Procedure Laterality Date   BACK SURGERY     CERVICAL FUSION  05/26/2008   CERVICAL LAMINECTOMY  05/26/1988   COLONOSCOPY W/ POLYPECTOMY     LUMBAR LAMINECTOMY/DECOMPRESSION MICRODISCECTOMY N/A 03/08/2015   Procedure: Lumbar vertebral three to four, lumbar vertebral four to five lumbar decompression;  Surgeon: Darina Boehringer, MD;  Location: [facility];  Service: Neurosurgery;  Laterality: N/A;   Male Fertility     testicular biopsy at age 61   NASAL SINUS SURGERY     NECK SURGERY      Prior to Admission medications  Medication Sig Start Date End Date Taking? Authorizing Provider  finasteride  (PROSCAR ) 5 MG tablet Take 5 mg by mouth daily.   Yes [provider]  Multiple Vitamins-Minerals (MENS MULTI VITAMIN & MINERAL) TABS Take by mouth.   Yes [provider]  tamsulosin  (FLOMAX ) 0.4 MG CAPS capsule TAKE (1) CAPSULE BY MOUTH ONCE DAILY. 03/18/24  Yes Dahlstedt, Garnette, MD  sildenafil  (VIAGRA ) 100 MG tablet Take 100 mg by mouth daily as needed for erectile dysfunction.    [provider]    Allergies as of 05/11/2024   (No Known Allergies)     Family History  Problem Relation Age of Onset   Colon cancer Mother 26   Hypertension Brother    Cancer Maternal Uncle 67 - 20       colon or stomach   Colon cancer Maternal Aunt 29 - 79       resection   Liver disease Neg Hx    Gallbladder disease Neg Hx     Social History   Socioeconomic History   Marital status: Married    Spouse name: Not on file   Number of children: Not on file   Years of education: Not on file   Highest education level: Not on file  Occupational History   Not on file  Tobacco Use   Smoking status: Never   Smokeless tobacco: Never  Vaping Use   Vaping status: Never Used  Substance and Sexual Activity   Alcohol use: No    Alcohol/week: 0.0 standard drinks of alcohol   Drug use: No   Sexual activity: Not on file  Other Topics Concern   Not on file  Social History Narrative   Not on file   Social Drivers of Health   Tobacco Use: Low Risk (03/17/2024)   Patient History    Smoking Tobacco Use: Never    Smokeless Tobacco Use: Never    Passive Exposure: Not on file  Financial Resource Strain: Not on file  Food Insecurity: No Food Insecurity (04/05/2022)   Hunger Vital Sign  Worried About Programme Researcher, Broadcasting/film/video in the Last Year: Never true    Ran Out of Food in the Last Year: Never true  Transportation Needs: No Transportation Needs (04/05/2022)   PRAPARE - Administrator, Civil Service (Medical): No    Lack of Transportation (Non-Medical): No  Physical Activity: Not on file  Stress: Not on file  Social Connections: Not on file  Intimate Partner Violence: Not At Risk (04/05/2022)   Humiliation, Afraid, Rape, and Kick questionnaire    Fear of Current or Ex-Partner: No    Emotionally Abused: No    Physically Abused: No    Sexually Abused: No  Depression (PHQ2-9): Not on file  Alcohol Screen: Not on file  Housing: Low Risk (04/05/2022)   Housing    Last Housing Risk Score: 0  Utilities: Not At Risk (04/05/2022)   AHC  Utilities    Threatened with loss of utilities: No  Health Literacy: Not on file    Review of Systems: See HPI, otherwise negative ROS  Physical Exam: Vital signs in last 24 hours: Temp:  [98.6 F (37 C)] 98.6 F (37 C) (01/13 0754) Resp:  [14] 14 (01/13 0754) BP: (121)/(74) 121/74 (01/13 0754) SpO2:  [99 %] 99 % (01/13 0754)   General:   Alert,  Well-developed, well-nourished, pleasant and cooperative in NAD Head:  Normocephalic and atraumatic. Eyes:  Sclera clear, no icterus.   Conjunctiva pink. Ears:  Normal auditory acuity. Nose:  No deformity, discharge,  or lesions. Msk:  Symmetrical without gross deformities. Normal posture. Extremities:  Without clubbing or edema. Neurologic:  Alert and  oriented x4;  grossly normal neurologically. Skin:  Intact without significant lesions or rashes. Psych:  Alert and cooperative. Normal mood and affect.  Impression/Plan: Geoffrey Andrews is here for a colonoscopy to be performed for Colorectal cancer screening, history of colon polyps, and family history of colon cancer  The risks of the procedure including infection, bleed, or perforation as well as benefits, limitations, alternatives and imponderables have been reviewed with the patient. Questions have been answered. All parties agreeable.  "

## 2024-06-07 NOTE — Discharge Instructions (Addendum)
" °  Colonoscopy Discharge Instructions  Read the instructions outlined below and refer to this sheet in the next few weeks. These discharge instructions provide you with general information on caring for yourself after you leave the hospital. Your doctor may also give you specific instructions. While your treatment has been planned according to the most current medical practices available, unavoidable complications occasionally occur.   ACTIVITY You may resume your regular activity, but move at a slower pace for the next 24 hours.  Take frequent rest periods for the next 24 hours.  Walking will help get rid of the air and reduce the bloated feeling in your belly (abdomen).  No driving for 24 hours (because of the medicine (anesthesia) used during the test).   Do not sign any important legal documents or operate any machinery for 24 hours (because of the anesthesia used during the test).  NUTRITION Drink plenty of fluids.  You may resume your normal diet as instructed by your doctor.  Begin with a light meal and progress to your normal diet. Heavy or fried foods are harder to digest and may make you feel sick to your stomach (nauseated).  Avoid alcoholic beverages for 24 hours or as instructed.  MEDICATIONS You may resume your normal medications unless your doctor tells you otherwise.  WHAT YOU CAN EXPECT TODAY Some feelings of bloating in the abdomen.  Passage of more gas than usual.  Spotting of blood in your stool or on the toilet paper.  IF YOU HAD POLYPS REMOVED DURING THE COLONOSCOPY: No aspirin products for 7 days or as instructed.  No alcohol for 7 days or as instructed.  Eat a soft diet for the next 24 hours.  FINDING OUT THE RESULTS OF YOUR TEST Not all test results are available during your visit. If your test results are not back during the visit, make an appointment with your caregiver to find out the results. Do not assume everything is normal if you have not heard from your  caregiver or the medical facility. It is important for you to follow up on all of your test results.  SEEK IMMEDIATE MEDICAL ATTENTION IF: You have more than a spotting of blood in your stool.  Your belly is swollen (abdominal distention).  You are nauseated or vomiting.  You have a temperature over 101.  You have abdominal pain or discomfort that is severe or gets worse throughout the day.   Your colonoscopy revealed 4 small polyp(s) which I removed successfully. Await pathology results, my office will contact you. I recommend repeating colonoscopy in 5 years for surveillance purposes, depending on pathology results.  Otherwise follow up with GI as needed.    I hope you have a great rest of your week!  Carlin POUR. Cindie, D.O. Gastroenterology and Hepatology [facility]  "

## 2024-06-07 NOTE — Op Note (Signed)
 [facility] Patient Name: Geoffrey Andrews Procedure Date: 06/07/2024 8:17 AM MRN: [id number] Date of Birth: 1955-05-28 Attending MD: Carlin POUR. Cindie HAS, [id number] CSN: [id number] Age: 69 Admit Type: Inpatient Procedure:                Colonoscopy Indications:              Screening in patient at increased risk: Family                            history of 1st-degree relative with colorectal                            cancer, Screening patient at increased risk: Family                            history of colorectal cancer in multiple relatives,                            Surveillance: Personal history of colonic polyps                            (unknown histology) on last colonoscopy more than 5                            years ago Providers:                Carlin POUR. Cindie, DO, Rosina Sprague, Devere Conan Daphne Rosalynn Technician, Technician Referring MD:              Medicines:                See the Anesthesia note for documentation of the                            administered medications Complications:            No immediate complications. Estimated Blood Loss:     Estimated blood loss was minimal. Procedure:                Pre-Anesthesia Assessment:                           - The anesthesia plan was to use monitored                            anesthesia care (MAC).                           After obtaining informed consent, the colonoscope                            was passed under direct vision. Throughout the                            procedure, the patient's blood pressure, pulse,  and                            oxygen saturations were monitored continuously. The                            [id number]-HQ190L ([id number]) Peds Colon was introduced                            through the anus and advanced to the the cecum,                            identified by appendiceal orifice and ileocecal                            valve. The colonoscopy was  performed without                            difficulty. The patient tolerated the procedure                            well. The quality of the bowel preparation was                            evaluated using the BBPS [facility] Bowel Preparation                            Scale) with scores of: Right Colon = 3, Transverse                            Colon = 3 and Left Colon = 3 (entire mucosa seen                            well with no residual staining, small fragments of                            stool or opaque liquid). The total BBPS score                            equals 9. Scope In: 8:33:44 AM Scope Out: 8:52:56 AM Scope Withdrawal Time: 0 hours 13 minutes 48 seconds  Total Procedure Duration: 0 hours 19 minutes 12 seconds  Findings:      Hemorrhoids were found on perianal exam.      Non-bleeding internal hemorrhoids were found.      Two sessile polyps were found in the cecum. The polyps were 4 to 5 mm in       size. These polyps were removed with a cold snare. Resection and       retrieval were complete.      Two sessile polyps were found in the transverse colon. The polyps were 4       to 5 mm in size. These polyps were removed with a cold snare. Resection       and retrieval were complete.      The exam was otherwise without  abnormality. Impression:               - Hemorrhoids found on perianal exam.                           - Non-bleeding internal hemorrhoids.                           - Two 4 to 5 mm polyps in the cecum, removed with a                            cold snare. Resected and retrieved.                           - Two 4 to 5 mm polyps in the transverse colon,                            removed with a cold snare. Resected and retrieved.                           - The examination was otherwise normal. Moderate Sedation:      Per Anesthesia Care Recommendation:           - Patient has a contact number available for                            emergencies. The  signs and symptoms of potential                            delayed complications were discussed with the                            patient. Return to normal activities tomorrow.                            Written discharge instructions were provided to the                            patient.                           - Resume previous diet.                           - Continue present medications.                           - Await pathology results.                           - Repeat colonoscopy in 5 years for surveillance                            and family history of colon cancer in multiple  relatives                           - Return to GI clinic PRN. Procedure Code(s):        --- Professional ---                           [id number], Colonoscopy, flexible; with removal of                            tumor(s), polyp(s), or other lesion(s) by snare                            technique Diagnosis Code(s):        --- Professional ---                           Z80.0, Family history of malignant neoplasm of                            digestive organs                           Z86.010, Personal history of colonic polyps                           D12.0, Benign neoplasm of cecum                           D12.3, Benign neoplasm of transverse colon (hepatic                            flexure or splenic flexure)                           K64.8, Other hemorrhoids CPT copyright 2022 American Medical Association. All rights reserved. The codes documented in this report are preliminary and upon coder review may  be revised to meet current compliance requirements. Carlin POUR. Cindie, DO Carlin POUR. Cindie, DO 06/07/2024 8:57:29 AM This report has been signed electronically. Number of Addenda: 0

## 2024-06-07 NOTE — Transfer of Care (Signed)
 Immediate Anesthesia Transfer of Care Note  Patient: Geoffrey Andrews  Procedure(s) Performed: COLONOSCOPY POLYPECTOMY, INTESTINE  Patient Location: Short Stay  Anesthesia Type:MAC  Level of Consciousness: awake and pateint uncooperative  Airway & Oxygen Therapy: Patient Spontanous Breathing  Post-op Assessment: Report given to RN and Post -op Vital signs reviewed and stable  Post vital signs: Reviewed and stable  Last Vitals:  Vitals Value Taken Time  BP 87/43 06/07/24 08:58  Temp 36.6 C 06/07/24 08:58  Pulse 51 06/07/24 08:58  Resp 12 06/07/24 08:58  SpO2 98 % 06/07/24 08:58    Last Pain:  Vitals:   06/07/24 0858  TempSrc: Oral  PainSc: 0-No pain      Patients Stated Pain Goal: 1 (06/07/24 0753)  Complications: No notable events documented.

## 2024-06-07 NOTE — Anesthesia Preprocedure Evaluation (Signed)
"                                    Anesthesia Evaluation  Patient identified by MRN, date of birth, ID band Patient awake    Reviewed: Allergy & Precautions, H&P , NPO status , Patient's Chart, lab work & pertinent test results, reviewed documented beta blocker date and time   History of Anesthesia Complications (+) history of anesthetic complications  Airway Mallampati: II  TM Distance: >3 FB Neck ROM: full    Dental no notable dental hx.    Pulmonary neg pulmonary ROS   Pulmonary exam normal breath sounds clear to auscultation       Cardiovascular Exercise Tolerance: Good hypertension, negative cardio ROS  Rhythm:regular Rate:Normal     Neuro/Psych  PSYCHIATRIC DISORDERS  Depression    negative neurological ROS     GI/Hepatic negative GI ROS, Neg liver ROS,,,  Endo/Other  negative endocrine ROS    Renal/GU negative Renal ROS  negative genitourinary   Musculoskeletal   Abdominal   Peds  Hematology negative hematology ROS (+)   Anesthesia Other Findings   Reproductive/Obstetrics negative OB ROS                              Anesthesia Physical Anesthesia Plan  ASA: 2  Anesthesia Plan: MAC   Post-op Pain Management:    Induction:   PONV Risk Score and Plan: Propofol  infusion  Airway Management Planned:   Additional Equipment:   Intra-op Plan:   Post-operative Plan:   Informed Consent: I have reviewed the patients History and Physical, chart, labs and discussed the procedure including the risks, benefits and alternatives for the proposed anesthesia with the patient or authorized representative who has indicated his/her understanding and acceptance.     Dental Advisory Given  Plan Discussed with: CRNA  Anesthesia Plan Comments:         Anesthesia Quick Evaluation  "

## 2024-06-08 ENCOUNTER — Encounter (HOSPITAL_COMMUNITY): Payer: Self-pay | Admitting: Internal Medicine

## 2024-06-08 LAB — SURGICAL PATHOLOGY

## 2024-06-09 ENCOUNTER — Ambulatory Visit: Payer: Self-pay | Admitting: Internal Medicine

## 2024-06-09 NOTE — Anesthesia Postprocedure Evaluation (Signed)
"   Anesthesia Post Note  Patient: Geoffrey Andrews  Procedure(s) Performed: COLONOSCOPY POLYPECTOMY, INTESTINE  Patient location during evaluation: Phase II Anesthesia Type: MAC Level of consciousness: awake Pain management: pain level controlled Vital Signs Assessment: post-procedure vital signs reviewed and stable Respiratory status: spontaneous breathing and respiratory function stable Cardiovascular status: blood pressure returned to baseline and stable Postop Assessment: no headache and no apparent nausea or vomiting Anesthetic complications: no Comments: Late entry   No notable events documented.   Last Vitals:  Vitals:   06/07/24 0903 06/07/24 0906  BP: (!) 89/50 (!) 95/50  Pulse: (!) 52 (!) 49  Resp: 17 17  Temp:    SpO2: 96% 96%    Last Pain:  Vitals:   06/08/24 1513  TempSrc:   PainSc: 0-No pain                 [name] Espen Bethel      "

## 2024-06-13 NOTE — Telephone Encounter (Signed)
5 yr TCS noted in recall procedure note and pathology result faxed to PCP

## 2024-07-26 ENCOUNTER — Ambulatory Visit: Payer: Medicare Other | Admitting: Urology
